# Patient Record
Sex: Female | Born: 1960 | Race: White | Hispanic: No | Marital: Married | Smoking: Never smoker
Health system: Southern US, Community
[De-identification: ages and names within clinical notes are randomized; demographics above are authoritative.]

## PROBLEM LIST (undated history)

## (undated) DIAGNOSIS — J45909 Unspecified asthma, uncomplicated: Secondary | ICD-10-CM

## (undated) DIAGNOSIS — M5430 Sciatica, unspecified side: Secondary | ICD-10-CM

## (undated) DIAGNOSIS — I10 Essential (primary) hypertension: Secondary | ICD-10-CM

## (undated) DIAGNOSIS — K219 Gastro-esophageal reflux disease without esophagitis: Secondary | ICD-10-CM

## (undated) DIAGNOSIS — I456 Pre-excitation syndrome: Secondary | ICD-10-CM

## (undated) DIAGNOSIS — T7840XA Allergy, unspecified, initial encounter: Secondary | ICD-10-CM

## (undated) DIAGNOSIS — C4490 Unspecified malignant neoplasm of skin, unspecified: Secondary | ICD-10-CM

## (undated) DIAGNOSIS — G709 Myoneural disorder, unspecified: Secondary | ICD-10-CM

## (undated) DIAGNOSIS — Z8249 Family history of ischemic heart disease and other diseases of the circulatory system: Secondary | ICD-10-CM

## (undated) DIAGNOSIS — R252 Cramp and spasm: Secondary | ICD-10-CM

## (undated) DIAGNOSIS — E785 Hyperlipidemia, unspecified: Secondary | ICD-10-CM

## (undated) DIAGNOSIS — S83282A Other tear of lateral meniscus, current injury, left knee, initial encounter: Secondary | ICD-10-CM

## (undated) DIAGNOSIS — R002 Palpitations: Secondary | ICD-10-CM

## (undated) HISTORY — DX: Sciatica, unspecified side: M54.30

## (undated) HISTORY — DX: Allergy, unspecified, initial encounter: T78.40XA

## (undated) HISTORY — DX: Palpitations: R00.2

## (undated) HISTORY — DX: Unspecified asthma, uncomplicated: J45.909

## (undated) HISTORY — DX: Essential (primary) hypertension: I10

## (undated) HISTORY — PX: COLONOSCOPY: SHX174

## (undated) HISTORY — PX: TUBAL LIGATION: SHX77

## (undated) HISTORY — DX: Gastro-esophageal reflux disease without esophagitis: K21.9

## (undated) HISTORY — PX: CARDIAC CATHETERIZATION: SHX172

## (undated) HISTORY — DX: Unspecified malignant neoplasm of skin, unspecified: C44.90

## (undated) HISTORY — DX: Cramp and spasm: R25.2

## (undated) HISTORY — PX: ABDOMINAL HYSTERECTOMY: SHX81

## (undated) HISTORY — DX: Family history of ischemic heart disease and other diseases of the circulatory system: Z82.49

## (undated) HISTORY — DX: Myoneural disorder, unspecified: G70.9

## (undated) HISTORY — DX: Pre-excitation syndrome: I45.6

## (undated) HISTORY — PX: CARDIAC ELECTROPHYSIOLOGY STUDY AND ABLATION: SHX1294

## (undated) HISTORY — PX: CHOLECYSTECTOMY: SHX55

## (undated) HISTORY — DX: Other tear of lateral meniscus, current injury, left knee, initial encounter: S83.282A

## (undated) HISTORY — PX: OTHER SURGICAL HISTORY: SHX169

## (undated) HISTORY — DX: Hyperlipidemia, unspecified: E78.5

---

## 2002-05-22 DIAGNOSIS — C439 Malignant melanoma of skin, unspecified: Secondary | ICD-10-CM

## 2002-05-22 HISTORY — DX: Malignant melanoma of skin, unspecified: C43.9

## 2002-08-04 ENCOUNTER — Other Ambulatory Visit: Admission: RE | Admit: 2002-08-04 | Discharge: 2002-08-04 | Payer: Self-pay | Admitting: Obstetrics and Gynecology

## 2002-09-10 DIAGNOSIS — D229 Melanocytic nevi, unspecified: Secondary | ICD-10-CM

## 2002-09-10 HISTORY — DX: Melanocytic nevi, unspecified: D22.9

## 2003-03-11 DIAGNOSIS — D229 Melanocytic nevi, unspecified: Secondary | ICD-10-CM

## 2003-03-11 HISTORY — DX: Melanocytic nevi, unspecified: D22.9

## 2005-06-05 ENCOUNTER — Other Ambulatory Visit: Admission: RE | Admit: 2005-06-05 | Discharge: 2005-06-05 | Payer: Self-pay | Admitting: Gynecology

## 2005-07-12 ENCOUNTER — Ambulatory Visit (HOSPITAL_BASED_OUTPATIENT_CLINIC_OR_DEPARTMENT_OTHER): Admission: RE | Admit: 2005-07-12 | Discharge: 2005-07-12 | Payer: Self-pay | Admitting: Gynecology

## 2005-07-12 ENCOUNTER — Encounter (INDEPENDENT_AMBULATORY_CARE_PROVIDER_SITE_OTHER): Payer: Self-pay | Admitting: *Deleted

## 2005-11-06 ENCOUNTER — Encounter (INDEPENDENT_AMBULATORY_CARE_PROVIDER_SITE_OTHER): Payer: Self-pay | Admitting: *Deleted

## 2005-11-06 ENCOUNTER — Ambulatory Visit (HOSPITAL_COMMUNITY): Admission: RE | Admit: 2005-11-06 | Discharge: 2005-11-07 | Payer: Self-pay | Admitting: *Deleted

## 2006-06-25 ENCOUNTER — Other Ambulatory Visit: Admission: RE | Admit: 2006-06-25 | Discharge: 2006-06-25 | Payer: Self-pay | Admitting: *Deleted

## 2006-07-04 ENCOUNTER — Ambulatory Visit: Payer: Self-pay | Admitting: Internal Medicine

## 2006-07-06 ENCOUNTER — Ambulatory Visit (HOSPITAL_COMMUNITY): Admission: RE | Admit: 2006-07-06 | Discharge: 2006-07-06 | Payer: Self-pay | Admitting: Internal Medicine

## 2006-07-06 ENCOUNTER — Ambulatory Visit: Payer: Self-pay | Admitting: Internal Medicine

## 2006-07-10 ENCOUNTER — Encounter (INDEPENDENT_AMBULATORY_CARE_PROVIDER_SITE_OTHER): Payer: Self-pay | Admitting: Internal Medicine

## 2008-01-01 ENCOUNTER — Other Ambulatory Visit: Admission: RE | Admit: 2008-01-01 | Discharge: 2008-01-01 | Payer: Self-pay | Admitting: Obstetrics and Gynecology

## 2009-02-17 ENCOUNTER — Other Ambulatory Visit: Admission: RE | Admit: 2009-02-17 | Discharge: 2009-02-17 | Payer: Self-pay | Admitting: Obstetrics and Gynecology

## 2010-01-13 DIAGNOSIS — C4491 Basal cell carcinoma of skin, unspecified: Secondary | ICD-10-CM

## 2010-01-13 HISTORY — DX: Basal cell carcinoma of skin, unspecified: C44.91

## 2010-06-28 NOTE — Op Note (Signed)
NAMEDANIKAH, Renee Walters                  ACCOUNT NO.:  [id number]   MEDICAL RECORD NO.:  [id number]          PATIENT TYPE:  AMB   LOCATION:  DAY                           FACILITY:  [facility]   PHYSICIAN:  Lionel December, M.D.    DATE OF BIRTH:  1960/06/12   DATE OF PROCEDURE:  07/06/2006  DATE OF DISCHARGE:                               OPERATIVE REPORT   PROCEDURE:  Colonoscopy.   INDICATION:  Renee Walters is a 50 year old Caucasian female who has had  diarrhea for over 20 years.  We have felt she has a IBS but she has  never responded well to therapy.  She has had multiple polyps removed  from her cervix and vagina and is concerned if she might have polyps in  her lower GI tract.  The patient was informed that there is no  association.  She is undergoing diagnostic colonoscopy.  Even if mucosa  is normal, a random biopsy was to be taken from sigmoid colon looking  for microscopic colitis.  The procedure risks were reviewed with the  patient, informed consent was obtained.   MEDICATIONS FOR CONSCIOUS SEDATION:  Demerol 50 mg IV, Versed 7 mg IV in  divided dose.   FINDINGS:  Procedure performed in endoscopy suite.  The patient's vital  signs and O2 saturation were monitored during the procedure and remained  stable.  The patient was placed in the left lateral recumbent position  and rectal examination performed.  No abnormality noted on external or  digital exam.  The Pentax video scope was placed in the rectum and  advanced under vision into sigmoid colon and beyond.  Preparation was  excellent.  The scope was passed into the cecum, which was identified by  appendiceal orifice and ileocecal valve.  Short segment of TI was also  examined and was normal.  Colonic mucosa was examined for the second  time on the way out and was normal throughout.  Random biopsies were  taken from the sigmoid colon for routine histology.  Rectal mucosa was  normal.  The scope was retroflexed to examine anorectal  junction, which  was unremarkable.  Endoscope was straightened and withdrawn.  The  patient tolerated the procedure well.   FINAL DIAGNOSIS:  Normal colonoscopy and terminal ileoscopy.  Biopsies  taken from sigmoid colon looking for microscopic and/or collagenous  colitis.   RECOMMENDATIONS:  Levbid half to 1 tablet every morning, and she will  start adding fiber supplement.  She will start 1 g per day and gradually  increase it to 4 g per day.   Please note, the patient had her celiac antibody panel drawn earlier  this week that is still pending.  I will be contacting patient with  results of biopsy and blood test.      Lionel December, M.D.  Electronically Signed     NR/MEDQ  D:  07/06/2006  T:  07/06/2006  Job:  [id number]   cc:   Selinda Flavin  Fax: [phone]

## 2010-06-28 NOTE — H&P (Signed)
NAME:  Renee Walters, Renee Walters                  ACCOUNT NO.:  [id number]   MEDICAL RECORD NO.:  [id number]          PATIENT TYPE:  AMB   LOCATION:  DAY                           FACILITY:  [facility]   PHYSICIAN:  Lionel December, M.D.    DATE OF BIRTH:  July 23, 1960   DATE OF ADMISSION:  DATE OF DISCHARGE:  LH                              HISTORY & PHYSICAL   PRESENTING COMPLAINT:  Chronic diarrhea and urgency.   HISTORY OF PRESENT ILLNESS:  This is a 50 year old Caucasian female who  has had diarrhea for at least 20 years.  I initially saw her in May 1994  and she had been symptomatic for 6 years.  She had been diagnosed with  irritable bowel syndrome but she has never had satisfactory response  therapy.  Workup at that time included normal small-bowel study,  negative stool studies.  She states she had been dealing with the  symptoms but last year she started to have more diarrhea and some lower  abdominal discomfort.  She states that she had hysterectomy in September  2007 and she has noted some relief of pressure and diarrhea but it has  not completely gone away.  What really bothers her is the urgency and  the fact that she has had a few accidents.  She states she has an  average of four stools per day, on worst day she may have six or more.  She has frequent lower abdominal cramping and urgency, every now and  then she has one formed stool.  She does not recall ever having been  constipated.  She has a good appetite.  She has occasional nausea but  denies vomiting, melena or rectal bleeding.  She has lost 23 pounds  voluntarily the past few months, she has been regularly exercising and  watching calorie intake.  She denies nocturnal bowel movements.  She  wonders if there is association between bread and her loose bowels.  Renee Walters states that she has had polyps removed from her lower genital  tract on six different occasions.  These were either cervical polyps or  vaginal polyps.  She was advised to  get her colon checked.  Her tests  for HPV have been negative.  She also gives history of having multiple  skin tags removed from her neck.  She feels maybe around 20.   She was given one medicine by Dr. Selinda Flavin which she is not taking  at the present time and does not remember the name.   PAST MEDICAL HISTORY:  Chronic diarrhea felt to be secondary to  irritable bowel syndrome.  She has had normal stool studies, normal  small-bowel follow-through in 1994 and normal colonoscopy in July 1995.   She had cholecystectomy for biliary dyskinesia in July 1994.  She had  hysterectomy in September 2007 she has had multiple polyps removed from  her cervix which on six different occasions she had six removed  recently.  She has history of hyperlipidemia but presently on no  therapy.   ALLERGIES:  TO PENICILLIN, WHICH RESULTED IN SYNCOPE.  SHE  MAY ALSO BE  ALLERGIC TO DAIRY PRODUCTS BECAUSE SHE GETS ILL SOMETIMES.   FAMILY HISTORY:  Father has coronary artery disease at age 50 and  presently in [place] participating in stem-cell studies.  Mother has  heart valve problems.  She has a sister age 22 with type 2 diabetes.  She also has obesity.  Her paternal grandmother was treated for colon  carcinoma at age 28 and is doing fine at age  46.   SOCIAL HISTORY:  She is married.  She has two children in good health.  She has never smoked cigarettes and does not drink alcohol.  She works  at [employer].   OBJECTIVE:  VITAL SIGNS:  Weight 159 pounds.  She is 5 feet 1 inch tall.  Pulse 68 per minute, blood pressure 160/100, blood pressure is 97.9.  HEENT:  Conjunctivae is pink.  Sclerae is nonicteric.  Oral pharyngeal  mucosa is normal.  No neck masses are noted.  CARDIAC:  Cardiac exam with regular rhythm.  Normal S1, S3.  No murmur  or gallop noted.  LUNGS:  Clear to auscultation.  ABDOMEN:  Abdomen is full, bowel sounds are normal.  Palpation reveals  soft abdomen with mild tenderness of both iliac  fossae without guarding.  No hepatosplenomegaly noted.  RECTAL:  Examination deferred.  No clubbing or edema noted.   ASSESSMENT:  Renee Walters is a 50 year old Caucasian female who has had two  decades of nonbloody diarrhea with urgency and occasional accidents.  It  is the unpredictability of her symptoms that is causing her lot of  distress.  While it is very likely that the final diagnosis would be  irritable bowel syndrome we need to rule out a few other conditions  which can mimic irritable bowel syndrome such celiac disease as well as  microscopic and/or collagenous colitis.  She may also have lactose  intolerance.   Her blood pressure is borderline today.  This will be rechecked at a  later date when she is in the hospital for procedure.   RECOMMENDATIONS:  1. Levbid 1/2 to 1 tablet every morning, if she has side effects she      can drop the dose.  Prescription given for 30 with 5 refills.  2. Celiac antibody panel.  3. Colonoscopy to be scheduled in the future.  I have reviewed the      procedure risks with the patient.  She is agreeable.  She would      definitely like to be sedated for this procedure.      Lionel December, M.D.  Electronically Signed     NR/MEDQ  D:  07/04/2006  T:  07/04/2006  Job:  [id number]   cc:   Selinda Flavin  Fax: [phone]

## 2010-07-01 NOTE — Op Note (Signed)
NAMEBRITNI, DRISCOLL                  ACCOUNT NO.:  [id number]   MEDICAL RECORD NO.:  [id number]          PATIENT TYPE:  AMB   LOCATION:  [facility]                         FACILITY:  [facility]   PHYSICIAN:  Ivor Costa. Farrel Gobble, M.D. DATE OF BIRTH:  July 25, 1960   DATE OF PROCEDURE:  DATE OF DISCHARGE:                                 OPERATIVE REPORT   Audio too short to transcribe (less than 5 seconds)      Ivor Costa. Farrel Gobble, M.D.     Leda Roys  D:  07/12/2005  T:  07/12/2005  Job:  [id number]

## 2010-07-01 NOTE — Discharge Summary (Signed)
Renee Walters, Renee Walters                  ACCOUNT NO.:  [id number]   MEDICAL RECORD NO.:  [id number]          PATIENT TYPE:  OIB   LOCATION:  1604                         FACILITY:  [facility]   PHYSICIAN:  Almedia Balls. Fore, M.D.   DATE OF BIRTH:  17-Nov-1960   DATE OF ADMISSION:  11/06/2005  DATE OF DISCHARGE:  11/07/2005                                 DISCHARGE SUMMARY   HISTORY:  The patient is a 50 year old with abnormal uterine bleeding,  uterine enlargement and pelvic pain for hysterectomy, possible bilateral  salpingo-oophorectomy on November 06, 2005.  The remainder of her history  and physical are as previously dictated.   LABORATORY DATA:  Preoperative hemoglobin 12.3.  Urine pregnancy test which  was negative.   HOSPITAL COURSE:  The patient was taken to the operating room on November 06, 2005, at which time abdominal supracervical hysterectomy, excision of  endocervical polyp were performed.  The patient did well postoperatively.  Diet and ambulation were progressed over the evening of November 06, 2005,  and early morning of November 07, 2005.  On the morning of November 07, 2005, she was afebrile and experiencing no problems, and it was felt that  she could be discharged at this time.   FINAL DIAGNOSES:  1. Abnormal uterine bleeding.  2. Uterine enlargement.  3. Pelvic pain.  4. Endocervical polyp.   OPERATION:  Abdominal supracervical hysterectomy, excision of endocervical  polyp.  Pathology report unavailable at the time of dictation.   DISPOSITION:  Discharged home to return to the office in 2 weeks for follow-  up.  She was instructed to gradually progress her activities at home and to  limit lifting and driving for 2 weeks.  She was fully ambulatory, on a  regular diet, and in good condition at the time of discharge.   DISCHARGE MEDICATIONS:  1. She was given prescriptions for Hydrocodone/APAP 10/325, #30 to be      taken one q.6-8h. p.r.n. pain.  2. Doxycycline  100 mg #12 to be taken one b.i.d.           ______________________________  Almedia Balls. Randell Patient, M.D.     SRF/MEDQ  D:  11/07/2005  T:  11/09/2005  Job:  [id number]

## 2010-07-01 NOTE — Op Note (Signed)
Renee Walters, Renee Walters                  ACCOUNT NO.:  [id number]   MEDICAL RECORD NO.:  [id number]          PATIENT TYPE:  OIB   LOCATION:  1604                         [facility]ACILITY:  [facility]   PHYSICIAN:  Almedia Balls. Fore, M.D.   DATE OF BIRTH:  12/21/1960   DATE OF PROCEDURE:  11/06/2005  DATE OF DISCHARGE:  11/07/2005                                 OPERATIVE REPORT   PREOPERATIVE DIAGNOSES:  Abnormal uterine bleeding, pelvic pain, uterine  enlargement.   POSTOPERATIVE DIAGNOSES:  Abnormal uterine bleeding, pelvic pain, uterine  enlargement. Pending pathology.   OPERATION:  Abdominal supracervical hysterectomy, excision of endocervical  polyp.   ANESTHESIA:  General orotracheal.   SURGEON:  Almedia Balls. Randell Patient, M.D.   FIRST ASSISTANT:  Dr. Beather Arbour.   INDICATIONS FOR SURGERY:  The patient is a 50 year old with the above-noted  problems who was counseled as to the need for surgery and the type of  surgery to be performed.  She was fully counseled as to the nature of the  procedure and the risks involved to include the risks of anesthesia, injury  to bowel, bladder, blood vessels, postoperative hemorrhage, infection and  recuperation plus possible hormone replacement should her ovaries be  removed.  She fully understands all these considerations and wishes to  proceed and has signed informed consent to proceed on November 06, 2005.   OPERATIVE FINDINGS:  On an attempt to effect descent of the uterus, it was  found that the uterus was well-supported and at that the vagina and uterus  would not descend into the vagina.  On entry into the abdomen, the uterus  was noted to be enlarged to approximately [redacted] weeks gestational size.  It was  quite soft suggesting adenomyosis.  Both ovaries had evidence of ovulation  with the corpus luteum present on the right ovary.  Exploration of the upper  abdomen revealed the lower liver edge, gallbladder, spleen, periaortic areas  and appendix to be normal  to visualization and/or palpation.   DESCRIPTION OF PROCEDURE:  With the patient under general anesthesia,  prepared and draped in the usual sterile fashion, the patient was placed in  a frog-leg position, and a speculum was placed in the vagina in an attempt  to effect descent of the uterus.  A single-tooth tenaculum was placed on the  cervix and the uterus was found to be too well-supported to descend into the  vagina.  Accordingly, it was felt that proceeding with an abdominal  procedure was indicated.  A Foley catheter was placed, and the patient was  repositioned and draped for abdominal procedure.   A lower abdominal transverse incision was made and carried into the  peritoneal cavity without difficulty.  A self-retaining retractor was placed  and the bowel was packed off.  Kelly clamps were used to clamp the utero-  ovarian anastomoses, tubes and round ligaments bilaterally for traction and  hemostasis.  Both round ligaments were transected using Bovie  electrocoagulation with development of a bladder flap anteriorly and entry  into the retroperitoneal space.  Because of the normal appearance  of the  ovaries, it was felt that they should be conserved.  Accordingly, a Heaney  clamp was placed across the uterine ovarian anastomoses and tubes  bilaterally; these structures were then cut free of the uterus and doubly  ligated with 2-0 PDS because of the patient's allergy to Vicryl. The uterine  vessels bilaterally were then skeletonized, clamped using Heaney clamps,  cut, and suture ligated with 2-0 PDS.  Similarly the cardinal ligaments and  remaining vascular bundle areas were clamped using Heaney clamps, cut, and  suture ligated with 2-0 PDS bilaterally.  It was then possible to excise the  uterine fundus from the cervix utilizing Bovie electrocoagulation.  At this  point, the endocervical polyp was encountered which was excised without  difficulty.  The endocervix was further  treated with Bovie  electrocoagulation to prevent any hemorrhage and to prevent future  leukorrhea.  The cervical stump was reapproximated and rendered hemostatic  with interrupted figure-of-eight sutures of 2-0 PDS.  There area was lavaged  copious amounts of lactated Ringer solution, and after noting that  hemostasis was maintained in the surgical site, the area was  reperitonealized with a continuous suture of 2-0 PDS.  The ovaries were  supported from the round ligaments bilaterally keeping them out of the  pelvis.  The area was observed for hemostasis, and after noting that  hemostasis was maintained and that sponge and instrument counts were  correct, the peritoneum was closed with a continuous suture of 2-0 PDS. The  fascia was closed with two sutures of #0 PDS which were brought from the  lateral aspects of the incision and tied together in the midline.  The  subcutaneous fat was reapproximated with interrupted horizontal mattress  sutures of #0 PDS.  The skin was closed with a subcuticular suture of 3-0  plain catgut.  Estimated blood loss was 150 mL.  The patient was taken to  the recovery room in good condition with clear urine in a Foley catheter  tubing.  She will be placed on an outpatient with extended recovery status  following PACU.           ______________________________  Almedia Balls. Randell Patient, M.D.     SRF/MEDQ  D:  11/06/2005  T:  11/07/2005  Job:  [id number]   cc:   Gretta Cool, M.D.  Fax: [phone]

## 2010-07-01 NOTE — H&P (Signed)
[name], [name]                  ACCOUNT NO.:  [id number]   MEDICAL RECORD NO.:  [id number]           Walters TYPE:   LOCATION:                               FACILITY:  [facility]   PHYSICIAN:  Almedia Balls. Fore, M.D.   DATE OF BIRTH:  27-Jul-1960   DATE OF ADMISSION:  11/06/2005  DATE OF DISCHARGE:                                HISTORY & PHYSICAL   HISTORY:  The Walters is a 50 year old, gravida 2, para 2 who has had  progressively severe menstrual flow and pain over the past several months.  She was evaluated by another gynecologist here in [place] who leaving  town at this time, and the Walters was referred to me for further treatment.  The Walters underwent hysteroscopy D&C in May 2007 because of the abnormal  bleeding with findings of an endometrial polyp with simple hyperplasia  without atypia.  She has continued to have abnormal bleeding since that time  in spite of being placed on oral contraceptives for suppression of ovaries  with the thought that some pelvic pain that the Walters had been  experiencing with be improved.  This did very little to suppress any pain or  change the bleeding pattern.  Preoperatively the Walters underwent  ultrasound which showed myomata of the uterus and possible adenomyosis of  the uterus.  Pap smear was normal in May 2007.  She is admitted at this time  for hysterectomy and possible bilateral salpingo-oophorectomy for the above-  noted problems.  She has been fully counseled as to the nature of the  procedure and the risks involved to include risks of anesthesia, injury to  bowel, bladder, blood vessels, ureters, postoperative hemorrhage, infection,  recuperation, possible use of hormone replacement should her ovaries be  removed.  She fully understands all these considerations and wishes to  proceed on 06 November 2005.   PAST MEDICAL HISTORY:  Tubal ligation in 1982, heart catheterization with  radiofrequency ablation of supraventricular node  with improvement in her  arrhythmias.  She has had no problems since, this was in '92, laparoscopic  cholecystectomy in '94 with an incisional infection.  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  This was found to be  probably related to Vicryl suture material.  That Walters takes Levsin SL  for probable IBS situation and has been on Loestrin 24 over the past several  months.  She is allergic to PENICILLIN, CODEINE, TROVAFLOXACIN and the  VICRYL SUTURES.  She denies tobacco or alcohol use or any caffeinated  beverages.   FAMILY HISTORY:  Paternal grandmother with carcinoma of the colon, father  and maternal grandmother and sister with diabetes mellitus with the  grandmother being insulin dependent. Father and sister and grandmother also  have had cardiovascular disease.   REVIEW OF SYSTEMS:  HEENT: Negative. CARDIORESPIRATORY:  As noted above.  GASTROINTESTINAL: As noted above.  GENITOURINARY:  As in present illness.  NEUROMUSCULAR:  Negative.   PHYSICAL EXAMINATION:  VITAL SIGNS:  Height 5  feet 1-1/4 inches, weight 172  pounds, blood pressure 142/90, respirations 18, pulse 76.  GENERAL:  Well-developed white female in no acute distress.  HEENT: Within normal limits.  NECK:  Supple without masses, adenopathy or bruits.  HEART:  Regular rate and rhythm without murmurs.  LUNGS:  Clear to P&A.  BREASTS:  Without mass bilaterally.  Axilla negative.  ABDOMEN:  Flat and soft with some tenderness bilaterally.  There were no  palpable masses.  PELVIC:  External genitalia, Bartholin's, urethra and Skene's glands within  normal limits.  Cervix somewhat inflamed.  The uterus is mid position,  approximately 8-[redacted] weeks gestational size. There are no adnexal masses but  somewhat tender bilaterally.  Anterior and posterior cul-de-sac exam is  confirmatory.  EXTREMITIES: Within normal limits.   CENTRAL NERVOUS SYSTEM:  Grossly intact.  SKIN:  Without suspicious lesions.   IMPRESSION:  Abnormal uterine bleeding, uterine enlargement.   DISPOSITION:  As noted above.           ______________________________  Almedia Balls. Renee Walters, M.D.     SRF/MEDQ  D:  10/30/2005  T:  10/30/2005  Job:  [id number]

## 2010-07-01 NOTE — Op Note (Signed)
NAMEJOLAYNE, BRANSON                  ACCOUNT NO.:  [id number]   MEDICAL RECORD NO.:  [id number]          PATIENT TYPE:  AMB   LOCATION:  NESC                         FACILITY:  [facility]   PHYSICIAN:  Ivor Costa. Farrel Gobble, M.D. DATE OF BIRTH:  March 23, 1960   DATE OF PROCEDURE:  07/12/2005  DATE OF DISCHARGE:                                 OPERATIVE REPORT   DATE OF OPERATION:  Jul 12, 2005.   PREOPERATIVE DIAGNOSIS:  Endometrial polyp with menorrhagia.   POSTOPERATIVE DIAGNOSIS:  Endometrial polyp with menorrhagia.   PROCEDURE:  D&C hysteroscopy.   SURGEON:  Ivor Costa. Lathrop, MD.   ANESTHESIA:  General with 10 ml of 0.5% Marcaine block,   I&O DEFICIT OF 30% SORBITOL SOLUTION:  20 ml.   ESTIMATED BLOOD LOSS:  Minimal.   FINDINGS:  The cervix was dilated secondary to placement of laminaria.  The  uterus was markedly anteflexed, sounding to 13 cm.  On hysteroscopy, large  endometrial polyps filled the upper portion of the cavity, the ostia were  visualized, the cavity was smooth at the end of the procedure.   DESCRIPTION OF PROCEDURE:  The patient was taken to the operating room, the  laminaria placed the night before was removed, and then prepped and draped  in the usual sterile fashion after a general anesthesia.  A bimanual exam  was performed to confirm the orientation of the uterus, a sterile weighted  speculum was then placed posteriorly, the cervix was visualized stabilized  with a single tooth tenaculum.  The cervix was then injected with a dilute  Pitressin solution for a total of 10 ml.  A uterine sound was placed to  confirm the length of the cavity as well as the orientation.  The cervix was  noted to be dilated.  The cervix was intraoperatively found to be long,  which required further dilation of the internal os from 29 to 31 after which  the operative hysteroscope was advanced through the cervix and into the  cavity.  The polyp obscured our visualization.  The scope was  removed, and  polyp forceps were placed high up into the cavity, and what appeared to be  polypoid tissue was removed.  We then replaced the hysteroscope, the base of  the polyp was then resected, the remainder of the cavity was smooth, a  gentle curettage was performed, which produced minimal additional tissue.  The ostia were visualized.  Slow retrieval  through the cervix showed no pathology.  The patient tolerated the procedure  well.  She was given 10 ml of 0.5% Marcaine for postoperative pain  management.  She was then extubated in the OR and transferred to the PACU in  stable condition.      Ivor Costa. Farrel Gobble, M.D.  Electronically Signed     THL/MEDQ  D:  07/12/2005  T:  07/12/2005  Job:  [id number]

## 2012-01-24 ENCOUNTER — Ambulatory Visit (INDEPENDENT_AMBULATORY_CARE_PROVIDER_SITE_OTHER): Payer: BC Managed Care – PPO | Admitting: Family Medicine

## 2012-01-24 ENCOUNTER — Encounter: Payer: Self-pay | Admitting: Family Medicine

## 2012-01-24 ENCOUNTER — Other Ambulatory Visit (HOSPITAL_COMMUNITY)
Admission: RE | Admit: 2012-01-24 | Discharge: 2012-01-24 | Disposition: A | Discharge status: Home / Self Care | Payer: BC Managed Care – PPO | Source: Ambulatory Visit | Attending: Family Medicine | Admitting: Family Medicine

## 2012-01-24 VITALS — BP 130/70 | HR 60 | Temp 98.2°F | Ht 60.75 in | Wt 158.0 lb

## 2012-01-24 DIAGNOSIS — I10 Essential (primary) hypertension: Secondary | ICD-10-CM

## 2012-01-24 DIAGNOSIS — Z136 Encounter for screening for cardiovascular disorders: Secondary | ICD-10-CM

## 2012-01-24 DIAGNOSIS — Z01419 Encounter for gynecological examination (general) (routine) without abnormal findings: Secondary | ICD-10-CM | POA: Insufficient documentation

## 2012-01-24 DIAGNOSIS — E785 Hyperlipidemia, unspecified: Secondary | ICD-10-CM

## 2012-01-24 DIAGNOSIS — Z1231 Encounter for screening mammogram for malignant neoplasm of breast: Secondary | ICD-10-CM

## 2012-01-24 DIAGNOSIS — Z8249 Family history of ischemic heart disease and other diseases of the circulatory system: Secondary | ICD-10-CM

## 2012-01-24 DIAGNOSIS — Z0000 Encounter for general adult medical examination without abnormal findings: Secondary | ICD-10-CM

## 2012-01-24 DIAGNOSIS — Z1151 Encounter for screening for human papillomavirus (HPV): Secondary | ICD-10-CM | POA: Insufficient documentation

## 2012-01-24 DIAGNOSIS — I456 Pre-excitation syndrome: Secondary | ICD-10-CM

## 2012-01-24 DIAGNOSIS — Z124 Encounter for screening for malignant neoplasm of cervix: Secondary | ICD-10-CM

## 2012-01-24 HISTORY — DX: Family history of ischemic heart disease and other diseases of the circulatory system: Z82.49

## 2012-01-24 LAB — COMPREHENSIVE METABOLIC PANEL
AST: 20 U/L (ref 0–37)
Albumin: 4.2 g/dL (ref 3.5–5.2)
CO2: 30 mEq/L (ref 19–32)
Chloride: 101 mEq/L (ref 96–112)
Sodium: 139 mEq/L (ref 135–145)

## 2012-01-24 LAB — LIPID PANEL
Cholesterol: 236 mg/dL — ABNORMAL HIGH (ref 0–200)
HDL: 62.5 mg/dL (ref >39.00)
Total CHOL/HDL Ratio: 4
Triglycerides: 131 mg/dL (ref 0.0–149.0)

## 2012-01-24 NOTE — Patient Instructions (Addendum)
Great to meet you. After you go to the lab, please stop by to see Renee Walters on your way out. We will call you with your lab results.  Have a wonderful Christmas.

## 2012-01-24 NOTE — Progress Notes (Signed)
Subjective:    Patient ID: Renee Walters, female    DOB: 1960/05/27, 51 y.o.   MRN: [id number]  HPI  51 yo G2P2  with h/o HTN, HLD here to establish care and would like a CPX today.  Due for mammogram.  Denies any family h/o breast, uterine or cervical CA. She did have a flu shot this year.  Works as Community education officer.  She is s/p hysterectomy for DUB but thinks she still has a cervix.  Has not had a pap smear or CPX in years. She did have a colonoscopy in 2008 due to irregular BMs- told it was IBS and colonoscopy was normal.  WPW- s/p ablation in 1992.  Has not seen a cardiologist since.  Denies any CP, SOB or palpiations.  HLD- has been on statin for over 10 years.  Strong family h/o HLD and CAD.  Dad had first MI in his 48s.  Patient Active Problem List  Diagnosis  . Hyperlipidemia  . Hypertension  . Routine general medical examination at a health care facility   Past Medical History  Diagnosis Date  . Hyperlipidemia   . Hypertension   . WPW (Wolff-Parkinson-White syndrome)     s/p ablation in 1992  . Skin cancer    Past Surgical History  Procedure Date  . Abdominal hysterectomy   . Cholecystectomy   . Tubal ligation    History  Substance Use Topics  . Smoking status: Never Smoker   . Smokeless tobacco: Not on file  . Alcohol Use: Not on file   Family History  Problem Relation Age of Onset  . Heart disease Father 71    s/p CABG, stents  . Heart disease Paternal Grandfather    Allergies  Allergen Reactions  . Codeine Nausea Only  . Penicillins Other (See Comments)    Passed out as a child   Current Outpatient Prescriptions on File Prior to Visit  Medication Sig Dispense Refill  . lisinopril-hydrochlorothiazide (PRINZIDE,ZESTORETIC) 10-12.5 MG per tablet Take 1 tablet by mouth daily.      . metoprolol (LOPRESSOR) 50 MG tablet Take 50 mg by mouth daily.      . simvastatin (ZOCOR) 80 MG tablet Take 80 mg by mouth at bedtime.       The PMH, PSH, Social  History, Family History, Medications, and allergies have been reviewed in [facility], and have been updated if relevant.   Review of Systems See HPI Patient reports no  vision/ hearing changes,anorexia, weight change, fever ,adenopathy, persistant / recurrent hoarseness, swallowing issues, chest pain, edema,persistant / recurrent cough, hemoptysis, dyspnea(rest, exertional, paroxysmal nocturnal), gastrointestinal  bleeding (melena, rectal bleeding), abdominal pain, excessive heart burn, GU symptoms(dysuria, hematuria, pyuria, voiding/incontinence  Issues) syncope, focal weakness, severe memory loss, concerning skin lesions, depression, anxiety, abnormal bruising/bleeding, major joint swelling, breast masses or abnormal vaginal bleeding.       Objective:   Physical Exam BP 130/70  Pulse 60  Temp 98.2 F (36.8 C)  Ht 5' 0.75" (1.543 m)  Wt 158 lb (71.668 kg)  BMI 30.10 kg/m2  General:  Well-developed,well-nourished,in no acute distress; alert,appropriate and cooperative throughout examination Head:  normocephalic and atraumatic.   Eyes:  vision grossly intact, pupils equal, pupils round, and pupils reactive to light.   Ears:  R ear normal and L ear normal.   Nose:  no external deformity.   Mouth:  good dentition.   Neck:  No deformities, masses, or tenderness noted. Breasts:  No mass, nodules, thickening, tenderness, bulging, retraction,  inflamation, nipple discharge or skin changes noted.   Lungs:  Normal respiratory effort, chest expands symmetrically. Lungs are clear to auscultation, no crackles or wheezes. Heart:  Normal rate and regular rhythm. S1 and S2 normal without gallop, murmur, click, rub or other extra sounds. Abdomen:  Bowel sounds positive,abdomen soft and non-tender without masses, organomegaly or hernias noted. Rectal:  no external abnormalities.   Genitalia:  Pelvic Exam:        External: normal female genitalia without lesions or masses        Vagina: normal without lesions  or masses        Cervix: normal without lesions or masses        Adnexa: normal bimanual exam without masses or fullness        Uterus: absent        Pap smear: performed Msk:  No deformity or scoliosis noted of thoracic or lumbar spine.   Extremities:  No clubbing, cyanosis, edema, or deformity noted with normal full range of motion of all joints.   Neurologic:  alert & oriented X3 and gait normal.   Skin:  Intact without suspicious lesions or rashes Cervical Nodes:  No lymphadenopathy noted Axillary Nodes:  No palpable lymphadenopathy Psych:  Cognition and judgment appear intact. Alert and cooperative with normal attention span and concentration. No apparent delusions, illusions, hallucinations      Assessment & Plan:   1. Routine general medical examination at a health care facility  Reviewed preventive care protocols, scheduled due services, and updated immunizations Discussed nutrition, exercise, diet, and healthy lifestyle.  Comprehensive metabolic panel, Cytology - PAP  2. Other screening mammogram  MM Digital Screening  3. Screening for ischemic heart disease  Lipid Panel  4. Screening for cervical cancer  Cytology - PAP  5. Family history of early CAD  Given family history and her h/o WPW s/p ablation, will refer to cards to get established. The patient indicates understanding of these issues and agrees with the plan.  Ambulatory referral to Cardiology  6. WPW (Wolff-Parkinson-White syndrome)  Asymptomatic.  See above. Ambulatory referral to Cardiology  7. Hypertension  Stable on current meds.   8. Hyperlipidemia  Recheck lipid panel, CMET today.

## 2012-01-31 ENCOUNTER — Encounter: Payer: Self-pay | Admitting: *Deleted

## 2012-02-27 ENCOUNTER — Telehealth: Payer: Self-pay

## 2012-02-27 NOTE — Telephone Encounter (Signed)
Left information requested on v/m pt to see Dr Rollene Rotunda on 02/28/12 at 3 pm; located at [facility] [address] [place] [address]; phone # [phone].

## 2012-02-27 NOTE — Telephone Encounter (Signed)
Pt left v/m cannot find information for appt with cardiologist; pt request call back with name of doctor where located and phone #. Left. V/m for pt to call back.

## 2012-02-28 ENCOUNTER — Ambulatory Visit (INDEPENDENT_AMBULATORY_CARE_PROVIDER_SITE_OTHER): Payer: BC Managed Care – PPO | Admitting: Cardiology

## 2012-02-28 ENCOUNTER — Encounter: Payer: Self-pay | Admitting: Cardiology

## 2012-02-28 VITALS — BP 125/75 | HR 60 | Ht 61.0 in | Wt 165.0 lb

## 2012-02-28 DIAGNOSIS — I456 Pre-excitation syndrome: Secondary | ICD-10-CM

## 2012-02-28 NOTE — Progress Notes (Signed)
HPI The patient presents for follow up of WPW. She has a history of palpitations with ablation in 1992. This was done at [facility].  She has had no tachycardia palpitations since that time.  She is active and exercises.  She denies any palpitations, presyncope or syncope. She denies any chest pressure, neck or arm discomfort. She he has no shortness of breath, PND or orthopnea.    Allergies  Allergen Reactions  . Codeine Nausea Only  . Penicillins Other (See Comments)    Passed out as a child    Current Outpatient Prescriptions  Medication Sig Dispense Refill  . lisinopril-hydrochlorothiazide (PRINZIDE,ZESTORETIC) 10-12.5 MG per tablet Take 1 tablet by mouth daily.      . metoprolol (LOPRESSOR) 50 MG tablet Take 50 mg by mouth daily.      . simvastatin (ZOCOR) 80 MG tablet Take 80 mg by mouth at bedtime.        Past Medical History  Diagnosis Date  . Hyperlipidemia   . Hypertension   . WPW (Wolff-Parkinson-White syndrome)     s/p ablation in 1992  . Skin cancer     Past Surgical History  Procedure Date  . Abdominal hysterectomy   . Cholecystectomy   . Tubal ligation   . Colonoscopy     Family History  Problem Relation Age of Onset  . Heart disease Father 13    s/p CABG, stents  . Heart disease Paternal Grandfather   . Evelene Croon Parkinson White syndrome Sister   . Evelene Croon Parkinson White syndrome      Sister's daughter    History   Social History  . Marital Status: Married    Spouse Name: N/A    Number of Children: 2  . Years of Education: N/A   Occupational History  .     Social History Main Topics  . Smoking status: Never Smoker   . Smokeless tobacco: Not on file  . Alcohol Use: Not on file  . Drug Use: Not on file  . Sexually Active: Not on file   Other Topics Concern  . Not on file   Social History Narrative  . No narrative on file    ROS:  As stated in the HPI and negative for all other systems.   PHYSICAL EXAM BP 125/75  Pulse 60  Ht 5\' 1"   (1.549 m)  Wt 165 lb (74.844 kg)  BMI 31.18 kg/m2 GENERAL:  Well appearing HEENT:  Pupils equal round and reactive, fundi not visualized, oral mucosa unremarkable NECK:  No jugular venous distention, waveform within normal limits, carotid upstroke brisk and symmetric, no bruits, no thyromegaly LYMPHATICS:  No cervical, inguinal adenopathy LUNGS:  Clear to auscultation bilaterally BACK:  No CVA tenderness CHEST:  Unremarkable HEART:  PMI not displaced or sustained,S1 and S2 within normal limits, no S3, no S4, no clicks, no rubs, no murmurs ABD:  Flat, positive bowel sounds normal in frequency in pitch, no bruits, no rebound, no guarding, no midline pulsatile mass, no hepatomegaly, no splenomegaly EXT:  2 plus pulses throughout, no edema, no cyanosis no clubbing SKIN:  No rashes no nodules NEURO:  Cranial nerves II through XII grossly intact, motor grossly intact throughout PSYCH:  Cognitively intact, oriented to person place and time  EKG:  Sinus rhythm, rate 60, left bundle branch block, left axis deviation. No old EKGs for comparison. 02/28/2012  ASSESSMENT AND PLAN  WPW  The patient has no symptoms related to this. No further cardiovascular testing is suggested.  HTN Her blood pressures well controlled. She will continue the meds as listed.  LBBB She reports that this is a chronic EKG finding. No further evaluation is necessary.  Dyslipidemia Her last LDL was in the 150s. This was on 80 mg of simvastatin. I suspect that her baseline LDL is probably greater than 190. Therefore, treatment is appropriate. I think this is even more appropriate given the family history. The suggestion that by guidelines would be too painful rate 50% reduction in LDL which we have not achieved. Given this I would suggest perhaps 80 mg of Lipitor or 40 mg of Crestor. I discussed this with her at length. She will consider this and further discuss this with her primary provider.

## 2012-02-28 NOTE — Patient Instructions (Addendum)
The current medical regimen is effective;  continue present plan and medications.  Follow up in 1 year with Dr Hochrein.  You will receive a letter in the mail 2 months before you are due.  Please call us when you receive this letter to schedule your follow up appointment.  

## 2012-03-05 ENCOUNTER — Ambulatory Visit
Admission: RE | Admit: 2012-03-05 | Discharge: 2012-03-05 | Disposition: A | Discharge status: Home / Self Care | Payer: BC Managed Care – PPO | Source: Ambulatory Visit | Attending: Family Medicine | Admitting: Family Medicine

## 2012-03-05 DIAGNOSIS — Z1231 Encounter for screening mammogram for malignant neoplasm of breast: Secondary | ICD-10-CM

## 2013-09-24 ENCOUNTER — Other Ambulatory Visit: Payer: Self-pay

## 2013-09-24 NOTE — Telephone Encounter (Signed)
Pt left v/m requesting refill lisinopril HCTZ and simvastatin to [facility] [address]. Please advise. Pt last seen 01/24/2012. No future appt scheduled.

## 2013-09-24 NOTE — Telephone Encounter (Signed)
Must be seen for further refills.  If follow up appt made, ok to refill until appointment only.

## 2013-09-25 MED ORDER — LISINOPRIL-HYDROCHLOROTHIAZIDE 10-12.5 MG PO TABS: 1.0000 | ORAL_TABLET | Freq: Every day | ORAL | Status: DC

## 2013-09-25 MED ORDER — SIMVASTATIN 80 MG PO TABS: 80.0000 mg | ORAL_TABLET | Freq: Every day | ORAL | Status: DC

## 2013-09-25 NOTE — Telephone Encounter (Signed)
Spoke to pt and scheduled f/u appt for 10/03/13. Rx sent for #30

## 2013-10-03 ENCOUNTER — Ambulatory Visit: Payer: BC Managed Care – PPO | Admitting: Family Medicine

## 2014-03-31 ENCOUNTER — Other Ambulatory Visit: Payer: Self-pay | Admitting: Dermatology

## 2014-09-16 ENCOUNTER — Telehealth: Payer: Self-pay | Admitting: Family Medicine

## 2014-09-16 NOTE — Telephone Encounter (Signed)
Pt dropped off past medical records that she sound at home while going through the files. If you have any questions, call [phone], thanks. Placing on cart for distribution

## 2014-11-05 ENCOUNTER — Ambulatory Visit (INDEPENDENT_AMBULATORY_CARE_PROVIDER_SITE_OTHER): Payer: BLUE CROSS/BLUE SHIELD | Admitting: Cardiology

## 2014-11-05 ENCOUNTER — Encounter: Payer: Self-pay | Admitting: Cardiology

## 2014-11-05 VITALS — BP 142/82 | HR 68 | Ht 61.0 in | Wt 180.5 lb

## 2014-11-05 DIAGNOSIS — I456 Pre-excitation syndrome: Secondary | ICD-10-CM

## 2014-11-05 DIAGNOSIS — R002 Palpitations: Secondary | ICD-10-CM

## 2014-11-05 DIAGNOSIS — Z79899 Other long term (current) drug therapy: Secondary | ICD-10-CM

## 2014-11-05 DIAGNOSIS — I10 Essential (primary) hypertension: Secondary | ICD-10-CM

## 2014-11-05 DIAGNOSIS — E785 Hyperlipidemia, unspecified: Secondary | ICD-10-CM | POA: Diagnosis not present

## 2014-11-05 HISTORY — DX: Palpitations: R00.2

## 2014-11-05 NOTE — Progress Notes (Signed)
HPI The patient presents for follow up of WPW. She has a history of palpitations with ablation in 1992. This was done at [facility].   Today I was able to review some records and she had a concealed left sided accessory pathway.  She has had no tachycardia palpitations since that time.   She has noted some increased isolated palpitations that she describes as skipped beat now again. She might have these daily they are not sustained and not like her previous symptoms. She does not have presyncope or syncope. She denies any chest pressure, neck or arm discomfort. She's not exercising as much as she used to and has a sedentary desk job. He has no new shortness of breath, PND or orthopnea.  Allergies  Allergen Reactions  . Codeine Nausea Only  . Penicillins Other (See Comments)    Passed out as a child    Current Outpatient Prescriptions  Medication Sig Dispense Refill  . lisinopril-hydrochlorothiazide (PRINZIDE,ZESTORETIC) 10-12.5 MG per tablet Take 1 tablet by mouth daily. 30 tablet 0  . metoprolol (LOPRESSOR) 50 MG tablet Take 50 mg by mouth daily.    . simvastatin (ZOCOR) 80 MG tablet Take 1 tablet (80 mg total) by mouth at bedtime. 30 tablet 0   No current facility-administered medications for this visit.    Past Medical History  Diagnosis Date  . Hyperlipidemia   . Hypertension   . WPW (Wolff-Parkinson-White syndrome)     s/p ablation in 1992  . Skin cancer     Past Surgical History  Procedure Laterality Date  . Abdominal hysterectomy    . Cholecystectomy    . Tubal ligation    . Colonoscopy       ROS:  As stated in the HPI and negative for all other systems.   PHYSICAL EXAM BP 142/82 mmHg  Pulse 68  Ht 5\' 1"  (1.549 m)  Wt 180 lb 8 oz (81.874 kg)  BMI 34.12 kg/m2 GENERAL:  Well appearing HEENT:  Pupils equal round and reactive, fundi not visualized, oral mucosa unremarkable NECK:  No jugular venous distention, waveform within normal limits, carotid upstroke brisk  and symmetric, no bruits, no thyromegaly LUNGS:  Clear to auscultation bilaterally BACK:  No CVA tenderness CHEST:  Unremarkable HEART:  PMI not displaced or sustained,S1 and S2 within normal limits, no S3, no S4, no clicks, no rubs, no murmurs ABD:  Flat, positive bowel sounds normal in frequency in pitch, no bruits, no rebound, no guarding, no midline pulsatile mass, no hepatomegaly, no splenomegaly EXT:  2 plus pulses throughout, no edema, no cyanosis no clubbing   EKG:  Sinus rhythm, rate 68, left bundle branch block, left axis deviation. No old EKGs for comparison. 11/05/2014  ASSESSMENT AND PLAN  WPW  The patient has no symptoms related to this. No further cardiovascular testing is suggested.  HTN Her blood pressures well controlled. She will continue the meds as listed.  LBBB She reports that this is a chronic EKG finding. No further evaluation is necessary.  Dyslipidemia Her last LDL was in the 150s. This was on 80 mg of simvastatin. I suspect that her baseline LDL is probably greater than 190. Therefore, treatment is appropriate. I think this is even more appropriate given the family history.   I will check a fasting lipid profile liver enzymes.  PALPITATIONS  I suspect she is having PACs or PVCs. She prefers no further treatment or evaluation has a are not particularly problematic. She'll let me know if they  get worse and time.

## 2014-11-05 NOTE — Patient Instructions (Signed)
Your physician recommends that you schedule a follow-up appointment in: As Needed  Your physician recommends that you return for lab work Fasting Lipids and CMP

## 2014-11-17 LAB — LIPID PANEL
Cholesterol: 270 mg/dL — ABNORMAL HIGH (ref 125–200)
HDL: 60 mg/dL (ref >=46)
LDL Cholesterol: 178 mg/dL — ABNORMAL HIGH (ref <130)
Total CHOL/HDL Ratio: 4.5 Ratio (ref <=5.0)
Triglycerides: 162 mg/dL — ABNORMAL HIGH (ref <150)
VLDL: 32 mg/dL — ABNORMAL HIGH (ref <30)

## 2014-11-17 LAB — COMPREHENSIVE METABOLIC PANEL
ALBUMIN: 4.2 g/dL (ref 3.6–5.1)
ALT: 25 U/L (ref 6–29)
AST: 17 U/L (ref 10–35)
Alkaline Phosphatase: 82 U/L (ref 33–130)
BILIRUBIN TOTAL: 0.4 mg/dL (ref 0.2–1.2)
BUN: 18 mg/dL (ref 7–25)
CO2: 27 mmol/L (ref 20–31)
CREATININE: 0.66 mg/dL (ref 0.50–1.05)
Calcium: 9.4 mg/dL (ref 8.6–10.4)
Chloride: 105 mmol/L (ref 98–110)
Glucose, Bld: 95 mg/dL (ref 65–99)
Potassium: 4.8 mmol/L (ref 3.5–5.3)
SODIUM: 141 mmol/L (ref 135–146)
TOTAL PROTEIN: 6.6 g/dL (ref 6.1–8.1)

## 2014-11-23 ENCOUNTER — Other Ambulatory Visit: Payer: Self-pay | Admitting: *Deleted

## 2014-11-23 DIAGNOSIS — E785 Hyperlipidemia, unspecified: Secondary | ICD-10-CM

## 2015-03-18 ENCOUNTER — Other Ambulatory Visit: Payer: Self-pay | Admitting: *Deleted

## 2015-03-18 MED ORDER — SIMVASTATIN 80 MG PO TABS: 80.0000 mg | ORAL_TABLET | Freq: Every day | ORAL | Status: DC

## 2015-03-18 NOTE — Telephone Encounter (Signed)
Rx request sent to pharmacy.  

## 2015-05-13 ENCOUNTER — Other Ambulatory Visit: Payer: Self-pay

## 2016-03-17 ENCOUNTER — Encounter: Payer: Self-pay | Admitting: Internal Medicine

## 2016-12-26 DIAGNOSIS — D229 Melanocytic nevi, unspecified: Secondary | ICD-10-CM | POA: Diagnosis not present

## 2016-12-26 DIAGNOSIS — Z85820 Personal history of malignant melanoma of skin: Secondary | ICD-10-CM | POA: Diagnosis not present

## 2017-01-02 ENCOUNTER — Encounter: Payer: Self-pay | Admitting: Family Medicine

## 2017-01-02 ENCOUNTER — Ambulatory Visit (INDEPENDENT_AMBULATORY_CARE_PROVIDER_SITE_OTHER): Payer: Commercial Managed Care - PPO | Admitting: Family Medicine

## 2017-01-02 VITALS — BP 136/84 | HR 73 | Temp 97.9°F | Ht 61.0 in | Wt 179.0 lb

## 2017-01-02 DIAGNOSIS — Z1239 Encounter for other screening for malignant neoplasm of breast: Secondary | ICD-10-CM

## 2017-01-02 DIAGNOSIS — E785 Hyperlipidemia, unspecified: Secondary | ICD-10-CM

## 2017-01-02 DIAGNOSIS — Z1211 Encounter for screening for malignant neoplasm of colon: Secondary | ICD-10-CM | POA: Diagnosis not present

## 2017-01-02 DIAGNOSIS — Z0000 Encounter for general adult medical examination without abnormal findings: Secondary | ICD-10-CM | POA: Insufficient documentation

## 2017-01-02 DIAGNOSIS — I10 Essential (primary) hypertension: Secondary | ICD-10-CM | POA: Diagnosis not present

## 2017-01-02 DIAGNOSIS — Z01419 Encounter for gynecological examination (general) (routine) without abnormal findings: Secondary | ICD-10-CM

## 2017-01-02 DIAGNOSIS — Z1231 Encounter for screening mammogram for malignant neoplasm of breast: Secondary | ICD-10-CM

## 2017-01-02 LAB — CBC WITH DIFFERENTIAL/PLATELET
BASOS PCT: 0.4 % (ref 0.0–3.0)
Basophils Absolute: 0 10*3/uL (ref 0.0–0.1)
EOS ABS: 0.2 10*3/uL (ref 0.0–0.7)
Eosinophils Relative: 3 % (ref 0.0–5.0)
HCT: 40.1 % (ref 36.0–46.0)
Hemoglobin: 13.1 g/dL (ref 12.0–15.0)
Lymphocytes Relative: 27.6 % (ref 12.0–46.0)
Lymphs Abs: 1.6 10*3/uL (ref 0.7–4.0)
MCHC: 32.7 g/dL (ref 30.0–36.0)
MCV: 88.9 fl (ref 78.0–100.0)
MONO ABS: 0.3 10*3/uL (ref 0.1–1.0)
Monocytes Relative: 5 % (ref 3.0–12.0)
NEUTROS ABS: 3.8 10*3/uL (ref 1.4–7.7)
Neutrophils Relative %: 64 % (ref 43.0–77.0)
PLATELETS: 288 10*3/uL (ref 150.0–400.0)
RBC: 4.51 Mil/uL (ref 3.87–5.11)
RDW: 14.4 % (ref 11.5–15.5)
WBC: 6 10*3/uL (ref 4.0–10.5)

## 2017-01-02 LAB — COMPREHENSIVE METABOLIC PANEL
ALBUMIN: 4.4 g/dL (ref 3.5–5.2)
ALT: 17 U/L (ref 0–35)
AST: 14 U/L (ref 0–37)
Alkaline Phosphatase: 85 U/L (ref 39–117)
BILIRUBIN TOTAL: 0.4 mg/dL (ref 0.2–1.2)
BUN: 11 mg/dL (ref 6–23)
CHLORIDE: 102 meq/L (ref 96–112)
CO2: 31 mEq/L (ref 19–32)
CREATININE: 0.65 mg/dL (ref 0.40–1.20)
Calcium: 9.8 mg/dL (ref 8.4–10.5)
GFR: 100.11 mL/min (ref >60.00)
Glucose, Bld: 101 mg/dL — ABNORMAL HIGH (ref 70–99)
Potassium: 4.6 mEq/L (ref 3.5–5.1)
SODIUM: 139 meq/L (ref 135–145)
Total Protein: 6.7 g/dL (ref 6.0–8.3)

## 2017-01-02 LAB — LIPID PANEL
CHOLESTEROL: 270 mg/dL — AB (ref 0–200)
HDL: 67.6 mg/dL (ref >39.00)
NonHDL: 202.48
TRIGLYCERIDES: 240 mg/dL — AB (ref 0.0–149.0)
Total CHOL/HDL Ratio: 4
VLDL: 48 mg/dL — ABNORMAL HIGH (ref 0.0–40.0)

## 2017-01-02 LAB — TSH: TSH: 2.63 u[IU]/mL (ref 0.35–4.50)

## 2017-01-02 LAB — LDL CHOLESTEROL, DIRECT: LDL DIRECT: 165 mg/dL

## 2017-01-02 NOTE — Progress Notes (Signed)
Subjective:   Patient ID: Renee Walters, female    DOB: 03/11/1960, 56 y.o.   MRN: [id number]  Renee Walters is a pleasant 56 y.o. year old female who presents to clinic today with New Patient (Initial Visit) and Annual Exam  on 01/02/2017  HPI:  Health Maintenance  Topic Date Due  . Hepatitis C Screening  [birth year]  . HIV Screening  09/21/1975  . TETANUS/TDAP  09/21/1979  . COLONOSCOPY  09/21/2010  . MAMMOGRAM  03/05/2014  . INFLUENZA VACCINE  Completed     Has not been since 2014. No taking any rxs anymore. Changed jobs- much happier, less stress. Does not feel she needs blood pressure medication anymore.  Remote h/o hysterectomy. Due for mammogram and colonoscopy.  Also due for labs.  No current outpatient medications on file prior to visit.   No current facility-administered medications on file prior to visit.     Allergies  Allergen Reactions  . Codeine Nausea Only  . Penicillins Other (See Comments)    Passed out as a child    Past Medical History:  Diagnosis Date  . Hyperlipidemia   . Hypertension   . Skin cancer   . WPW (Wolff-Parkinson-White syndrome)    s/p ablation in 1992    Past Surgical History:  Procedure Laterality Date  . ABDOMINAL HYSTERECTOMY    . CHOLECYSTECTOMY    . COLONOSCOPY    . TUBAL LIGATION      Family History  Problem Relation Age of Onset  . Heart disease Father 68       s/p CABG, stents  . Heart disease Paternal Grandfather   . Yves Dill Parkinson White syndrome Sister   . Yves Dill Parkinson White syndrome Unknown        Sister's daughter    Social History   Socioeconomic History  . Marital status: Married    Spouse name: Not on file  . Number of children: 2  . Years of education: Not on file  . Highest education level: Not on file  Social Needs  . Financial resource strain: Not on file  . Food insecurity - worry: Not on file  . Food insecurity - inability: Not on file  . Transportation needs - medical: Not  on file  . Transportation needs - non-medical: Not on file  Occupational History  . Not on file  Tobacco Use  . Smoking status: Never Smoker  . Smokeless tobacco: Never Used  Substance and Sexual Activity  . Alcohol use: Yes    Frequency: Never    Comment: occasionally  . Drug use: No  . Sexual activity: Not on file  Other Topics Concern  . Not on file  Social History Narrative  . Not on file   The PMH, PSH, Social History, Family History, Medications, and allergies have been reviewed in [facility], and have been updated if relevant.   Review of Systems  Constitutional: Negative.   HENT: Negative.   Eyes: Negative.   Respiratory: Negative.   Cardiovascular: Negative.   Gastrointestinal: Negative.   Endocrine: Negative.   Genitourinary: Negative.   Musculoskeletal: Negative.   Skin: Negative.   Allergic/Immunologic: Negative.   Neurological: Negative.   Hematological: Negative.   Psychiatric/Behavioral: Negative.   All other systems reviewed and are negative.      Objective:    BP 136/84 (BP Location: Left Arm, Patient Position: Sitting, Cuff Size: Normal)   Pulse 73   Temp 97.9 F (36.6 C) (Oral)   Ht 5'  1" (1.549 m)   Wt 179 lb (81.2 kg)   SpO2 100%   BMI 33.82 kg/m    Physical Exam   General:  Well-developed,well-nourished,in no acute distress; alert,appropriate and cooperative throughout examination Head:  normocephalic and atraumatic.   Eyes:  vision grossly intact, PERRL Ears:  R ear normal and L ear normal externally, TMs clear bilaterally Nose:  no external deformity.   Mouth:  good dentition.   Neck:  No deformities, masses, or tenderness noted. Breasts:  No mass, nodules, thickening, tenderness, bulging, retraction, inflamation, nipple discharge or skin changes noted.   Lungs:  Normal respiratory effort, chest expands symmetrically. Lungs are clear to auscultation, no crackles or wheezes. Heart:  Normal rate and regular rhythm. S1 and S2 normal  without gallop, murmur, click, rub or other extra sounds. Abdomen:  Bowel sounds positive,abdomen soft and non-tender without masses, organomegaly or hernias noted. Rectal:  no external abnormalities.   Genitalia:  Pelvic Exam:        External: normal female genitalia without lesions or masses        Vagina: normal without lesions or masses        Cervix: absent        Adnexa: normal bimanual exam without masses or fullness        Uterus: absent Msk:  No deformity or scoliosis noted of thoracic or lumbar spine.   Extremities:  No clubbing, cyanosis, edema, or deformity noted with normal full range of motion of all joints.   Neurologic:  alert & oriented X3 and gait normal.   Skin:  Intact without suspicious lesions or rashes Cervical Nodes:  No lymphadenopathy noted Axillary Nodes:  No palpable lymphadenopathy Psych:  Cognition and judgment appear intact. Alert and cooperative with normal attention span and concentration. No apparent delusions, illusions, hallucinations       Assessment & Plan:   Well woman exam - Plan: CBC with Differential/Platelet, Comprehensive metabolic panel, Lipid panel, TSH  Essential hypertension  Hyperlipidemia, unspecified hyperlipidemia type  Screening for breast cancer - Plan: MM Digital Screening  Screening for malignant neoplasm of colon - Plan: Ambulatory referral to Gastroenterology No Follow-up on file.

## 2017-01-02 NOTE — Assessment & Plan Note (Signed)
Reviewed preventive care protocols, scheduled due services, and updated immunizations Discussed nutrition, exercise, diet, and healthy lifestyle.  Orders Placed This Encounter  Procedures  . MM Digital Screening  . CBC with Differential/Platelet  . Comprehensive metabolic panel  . Lipid panel  . TSH  . Ambulatory referral to Gastroenterology

## 2017-01-02 NOTE — Patient Instructions (Signed)
Great to see you.  Please stop by to see Vaughan Basta on your way out.  Please call the breast center at [phone] to schedule your mammogram.  We will call you with your results and you can view them online.

## 2017-01-03 ENCOUNTER — Other Ambulatory Visit: Payer: Self-pay | Admitting: Family Medicine

## 2017-01-03 DIAGNOSIS — E785 Hyperlipidemia, unspecified: Secondary | ICD-10-CM

## 2017-01-03 MED ORDER — SIMVASTATIN 20 MG PO TABS: 20.0000 mg | ORAL_TABLET | Freq: Every day | ORAL | 3 refills | Status: DC

## 2017-01-12 ENCOUNTER — Encounter: Payer: Self-pay | Admitting: *Deleted

## 2017-01-12 ENCOUNTER — Other Ambulatory Visit: Payer: Self-pay | Admitting: Family Medicine

## 2017-01-12 DIAGNOSIS — Z1231 Encounter for screening mammogram for malignant neoplasm of breast: Secondary | ICD-10-CM

## 2017-01-31 ENCOUNTER — Encounter: Payer: Self-pay | Admitting: Gastroenterology

## 2017-02-14 ENCOUNTER — Ambulatory Visit: Payer: Self-pay

## 2017-03-12 ENCOUNTER — Other Ambulatory Visit: Payer: Self-pay | Admitting: Family Medicine

## 2017-03-12 MED ORDER — SIMVASTATIN 20 MG PO TABS: 20.0000 mg | ORAL_TABLET | Freq: Every day | ORAL | 3 refills | Status: DC

## 2017-03-12 NOTE — Telephone Encounter (Signed)
Copied from [place] [id number]. Topic: General - Other >> Mar 12, 2017  2:10 PM Darl Householder, RMA wrote: Reason for CRM: Medication refill request for Simvastatin 20 mg to be sent to [facility]

## 2017-03-16 ENCOUNTER — Ambulatory Visit
Admission: RE | Admit: 2017-03-16 | Discharge: 2017-03-16 | Disposition: A | Discharge status: Home / Self Care | Payer: Commercial Managed Care - PPO | Source: Ambulatory Visit | Attending: Family Medicine | Admitting: Family Medicine

## 2017-03-16 DIAGNOSIS — Z1231 Encounter for screening mammogram for malignant neoplasm of breast: Secondary | ICD-10-CM | POA: Diagnosis not present

## 2017-03-19 ENCOUNTER — Ambulatory Visit: Payer: Self-pay | Admitting: Family Medicine

## 2017-03-19 ENCOUNTER — Ambulatory Visit (AMBULATORY_SURGERY_CENTER): Payer: Self-pay | Admitting: *Deleted

## 2017-03-19 ENCOUNTER — Other Ambulatory Visit: Payer: Self-pay

## 2017-03-19 VITALS — Ht 61.0 in | Wt 185.0 lb

## 2017-03-19 DIAGNOSIS — Z1211 Encounter for screening for malignant neoplasm of colon: Secondary | ICD-10-CM

## 2017-03-19 MED ORDER — NA SULFATE-K SULFATE-MG SULF 17.5-3.13-1.6 GM/177ML PO SOLN: 1.0000 | Freq: Once | ORAL | 0 refills | Status: AC

## 2017-03-19 NOTE — Progress Notes (Signed)
No egg or soy allergy known to patient  No issues with past sedation with any surgeries  or procedures, no intubation problems  No diet pills per patient No home 02 use per patient  No blood thinners per patient  Pt denies issues with constipation  No A fib or A flutter  EMMI video sent to pt's e mail  

## 2017-03-20 ENCOUNTER — Encounter: Payer: Self-pay | Admitting: Gastroenterology

## 2017-03-20 ENCOUNTER — Other Ambulatory Visit: Payer: Self-pay

## 2017-03-21 ENCOUNTER — Ambulatory Visit (INDEPENDENT_AMBULATORY_CARE_PROVIDER_SITE_OTHER): Payer: Commercial Managed Care - PPO | Admitting: Family Medicine

## 2017-03-21 ENCOUNTER — Encounter: Payer: Self-pay | Admitting: Family Medicine

## 2017-03-21 VITALS — BP 132/88 | HR 74 | Temp 98.5°F | Ht 61.0 in | Wt 181.8 lb

## 2017-03-21 DIAGNOSIS — R252 Cramp and spasm: Secondary | ICD-10-CM | POA: Diagnosis not present

## 2017-03-21 DIAGNOSIS — Z23 Encounter for immunization: Secondary | ICD-10-CM | POA: Diagnosis not present

## 2017-03-21 DIAGNOSIS — M5430 Sciatica, unspecified side: Secondary | ICD-10-CM | POA: Diagnosis not present

## 2017-03-21 HISTORY — DX: Cramp and spasm: R25.2

## 2017-03-21 HISTORY — DX: Sciatica, unspecified side: M54.30

## 2017-03-21 LAB — COMPREHENSIVE METABOLIC PANEL
ALK PHOS: 80 U/L (ref 39–117)
ALT: 20 U/L (ref 0–35)
AST: 16 U/L (ref 0–37)
Albumin: 4.4 g/dL (ref 3.5–5.2)
BUN: 15 mg/dL (ref 6–23)
CO2: 31 meq/L (ref 19–32)
Calcium: 9.5 mg/dL (ref 8.4–10.5)
Chloride: 104 mEq/L (ref 96–112)
Creatinine, Ser: 0.66 mg/dL (ref 0.40–1.20)
GFR: 98.29 mL/min (ref >60.00)
GLUCOSE: 116 mg/dL — AB (ref 70–99)
POTASSIUM: 5.1 meq/L (ref 3.5–5.1)
Sodium: 142 mEq/L (ref 135–145)
Total Bilirubin: 0.6 mg/dL (ref 0.2–1.2)
Total Protein: 6.9 g/dL (ref 6.0–8.3)

## 2017-03-21 LAB — VITAMIN D 25 HYDROXY (VIT D DEFICIENCY, FRACTURES): VITD: 21.8 ng/mL — ABNORMAL LOW (ref 30.00–100.00)

## 2017-03-21 LAB — CBC
HCT: 40.3 % (ref 36.0–46.0)
HEMOGLOBIN: 13 g/dL (ref 12.0–15.0)
MCHC: 32.2 g/dL (ref 30.0–36.0)
MCV: 88.6 fl (ref 78.0–100.0)
PLATELETS: 292 10*3/uL (ref 150.0–400.0)
RBC: 4.55 Mil/uL (ref 3.87–5.11)
RDW: 14.2 % (ref 11.5–15.5)
WBC: 6.1 10*3/uL (ref 4.0–10.5)

## 2017-03-21 LAB — MAGNESIUM: Magnesium: 2.4 mg/dL (ref 1.5–2.5)

## 2017-03-21 LAB — TSH: TSH: 3.32 u[IU]/mL (ref 0.35–4.50)

## 2017-03-21 MED ORDER — CYCLOBENZAPRINE HCL 5 MG PO TABS: 5.0000 mg | ORAL_TABLET | Freq: Three times a day (TID) | ORAL | 1 refills | Status: DC | PRN

## 2017-03-21 NOTE — Patient Instructions (Signed)
Great to see you. I will call you with your lab results from today and you can view them online.   Use flexeril as needed for spasms. Please keep me updated.

## 2017-03-21 NOTE — Assessment & Plan Note (Signed)
New- discussed work up for cramping with pt- start with labs today to rule out some common reversible causes. The patient indicates understanding of these issues and agrees with the plan. Orders Placed This Encounter  Procedures  . Tdap vaccine greater than or equal to 57yo IM  . Comprehensive metabolic panel  . CBC  . Magnesium  . Vitamin D (25 hydroxy)  . TSH

## 2017-03-21 NOTE — Assessment & Plan Note (Signed)
New- exam reassuring. SLR neg bilaterally.  Most consistent with spasm.  For acute pain, rest, intermittent application of heat (do not sleep on heating pad), analgesics and muscle relaxants are recommended. Discussed longer term treatment plan of prn NSAID's and discussed a home back care exercise program with flexion exercise routine. Proper lifting with avoidance of heavy lifting discussed. Consider Physical Therapy and XRay studies if not improving. Call or return to clinic prn if these symptoms worsen or fail to improve as anticipated.

## 2017-03-21 NOTE — Progress Notes (Signed)
Subjective:   Patient ID: Renee Walters, female    DOB: 1960-02-24, 57 y.o.   MRN: [id number]  Renee Walters is a pleasant 57 y.o. year old female who presents to clinic today with Leg Problem (Started having cramping primarily in the left calf and sometimes in the right which has been going on x1 month.  Wakes around 3am with it at least 3 times weekly.  She started working out with spouse in January on treadmill and caused swelling and pain in right knee. FYI: Colonoscopy scheduled for 2.18.19 with Dr. Ardis Hughs.) and Sciatica (Patient is also C/O a flare-up of sciatica.  On 2.2.19 she was standing a lot most of the day  on a marle floor. When waking on 2.3.19 her sciatica became bothersome and she usually Tx with muscle relaxants, antiinflammatories, and ice but she does not have any muscle relaxants.  She has been taking 3 ibuprofen 3-4 xqd.  The pain mostly shoots across back but will occas go down right leg to mid-thigh.)  on 03/21/2017  HPI:   Leg cramps- intermittently for a month.  Wakes her up at 3 am at least 3 times weekly. She did start exercising on treadmill in 02/2017 which has caused some swelling and pain in right knee.  Also having some sciatic pain since 03/18/17.  Had been standing on marble floor most of 03/17/17.  Usually treats this with muscle relaxants, NSAIDs and ice but out of muscle relaxants.  Current Outpatient Medications on File Prior to Visit  Medication Sig Dispense Refill  . ibuprofen (ADVIL,MOTRIN) 200 MG tablet Take 600 mg by mouth every 6 (six) hours as needed.    . simvastatin (ZOCOR) 20 MG tablet Take 1 tablet (20 mg total) by mouth at bedtime. 90 tablet 3   No current facility-administered medications on file prior to visit.     Allergies  Allergen Reactions  . Codeine Nausea Only  . Other     Vicryl sutures blisters at site   . Penicillins Other (See Comments)    Passed out as a child    Past Medical History:  Diagnosis Date  . Allergy   .  Asthma   . GERD (gastroesophageal reflux disease)    occ  . Hyperlipidemia   . Hypertension   . Neuromuscular disorder (HCC)    sciatica  . Skin cancer   . WPW (Wolff-Parkinson-White syndrome)    s/p ablation in 1992    Past Surgical History:  Procedure Laterality Date  . ABDOMINAL HYSTERECTOMY    . CARDIAC CATHETERIZATION    . CARDIAC ELECTROPHYSIOLOGY STUDY AND ABLATION    . CHOLECYSTECTOMY    . COLONOSCOPY    . exploratory GYN surgery    . skin cancer removal     several  . TUBAL LIGATION      Family History  Problem Relation Age of Onset  . Arthritis Mother   . COPD Mother   . Diabetes Mother   . Hearing loss Mother   . Hypertension Mother   . Miscarriages / [place] Mother   . Heart disease Father 59       s/p CABG, stents  . Diabetes Father   . Heart attack Father   . Hyperlipidemia Father   . Hypertension Father   . Heart disease Paternal Grandfather   . Yves Dill Parkinson White syndrome Sister   . Cancer Sister   . Diabetes Sister   . Hyperlipidemia Sister   . Hypertension Sister   .  Yves Dill Parkinson White syndrome Unknown        Sister's daughter  . Hyperlipidemia Brother   . Hypertension Brother   . Hyperlipidemia Brother   . Hypertension Brother   . Lung cancer Maternal Grandfather   . Colon polyps Paternal Grandmother   . Colon cancer Neg Hx   . Esophageal cancer Neg Hx   . Rectal cancer Neg Hx   . Stomach cancer Neg Hx     Social History   Socioeconomic History  . Marital status: Married    Spouse name: Not on file  . Number of children: 2  . Years of education: Not on file  . Highest education level: Not on file  Social Needs  . Financial resource strain: Not on file  . Food insecurity - worry: Not on file  . Food insecurity - inability: Not on file  . Transportation needs - medical: Not on file  . Transportation needs - non-medical: Not on file  Occupational History    Employer: [employer]  Tobacco Use  . Smoking status: Never  Smoker  . Smokeless tobacco: Never Used  Substance and Sexual Activity  . Alcohol use: Yes    Frequency: Never    Comment: occasionally  . Drug use: No  . Sexual activity: Not on file  Other Topics Concern  . Not on file  Social History Narrative  . Not on file   The PMH, PSH, Social History, Family History, Medications, and allergies have been reviewed in [facility], and have been updated if relevant.   Review of Systems  Respiratory: Negative.   Cardiovascular: Negative.   Gastrointestinal: Negative.   Musculoskeletal: Positive for back pain and myalgias. Negative for gait problem, joint swelling, neck pain and neck stiffness.  All other systems reviewed and are negative.      Objective:    BP 132/88 (BP Location: Left Arm, Patient Position: Sitting, Cuff Size: Normal)   Pulse 74   Temp 98.5 F (36.9 C) (Oral)   Ht 5\' 1"  (1.549 m)   Wt 181 lb 12.8 oz (82.5 kg)   SpO2 97%   BMI 34.35 kg/m    Physical Exam  Constitutional: She is oriented to person, place, and time. She appears well-developed and well-nourished. No distress.  HENT:  Head: Normocephalic and atraumatic.  Eyes: Conjunctivae are normal.  Neck: Normal range of motion.  Cardiovascular: Normal rate.  Pulmonary/Chest: Effort normal.  Musculoskeletal: Normal range of motion. She exhibits no edema.       Lumbar back: She exhibits pain and spasm. She exhibits normal range of motion, no tenderness, no bony tenderness, no swelling, no edema and no deformity.  Neurological: She is alert and oriented to person, place, and time. No cranial nerve deficit.  Skin: Skin is warm and dry. She is not diaphoretic.  Psychiatric: She has a normal mood and affect. Her behavior is normal. Judgment and thought content normal.  Nursing note and vitals reviewed.         Assessment & Plan:   Leg cramping - Plan: Comprehensive metabolic panel, CBC, Magnesium, Vitamin D (25 hydroxy), TSH  Need for Tdap vaccination - Plan: Tdap  vaccine greater than or equal to 7yo IM  Sciatica, unspecified laterality No Follow-up on file.

## 2017-03-22 ENCOUNTER — Other Ambulatory Visit: Payer: Self-pay | Admitting: Family Medicine

## 2017-03-22 DIAGNOSIS — R252 Cramp and spasm: Secondary | ICD-10-CM

## 2017-03-22 MED ORDER — VITAMIN D3 1.25 MG (50000 UT) PO TABS: 50000.0000 [IU] | ORAL_TABLET | ORAL | 0 refills | Status: DC

## 2017-03-22 NOTE — Progress Notes (Signed)
Lab entered as PCP request.

## 2017-03-22 NOTE — Addendum Note (Signed)
Addended byShawnie Pons on: 03/22/2017 11:25 AM   Modules accepted: Orders

## 2017-04-02 ENCOUNTER — Encounter: Payer: Self-pay | Admitting: Gastroenterology

## 2017-04-02 ENCOUNTER — Ambulatory Visit (AMBULATORY_SURGERY_CENTER): Payer: Commercial Managed Care - PPO | Admitting: Gastroenterology

## 2017-04-02 VITALS — BP 156/76 | HR 73 | Temp 97.5°F | Resp 14 | Ht 61.0 in | Wt 181.0 lb

## 2017-04-02 DIAGNOSIS — I10 Essential (primary) hypertension: Secondary | ICD-10-CM | POA: Diagnosis not present

## 2017-04-02 DIAGNOSIS — D124 Benign neoplasm of descending colon: Secondary | ICD-10-CM

## 2017-04-02 DIAGNOSIS — D123 Benign neoplasm of transverse colon: Secondary | ICD-10-CM

## 2017-04-02 DIAGNOSIS — Z1211 Encounter for screening for malignant neoplasm of colon: Secondary | ICD-10-CM | POA: Diagnosis present

## 2017-04-02 DIAGNOSIS — J45909 Unspecified asthma, uncomplicated: Secondary | ICD-10-CM | POA: Diagnosis not present

## 2017-04-02 MED ORDER — SODIUM CHLORIDE 0.9 % IV SOLN: 500.0000 mL | Freq: Once | INTRAVENOUS | Status: AC

## 2017-04-02 NOTE — Op Note (Signed)
[facility] Patient Name: Renee Walters Procedure Date: 04/02/2017 7:51 AM MRN: [id number] Endoscopist: Milus Banister , MD Age: 57 Referring MD:  Date of Birth: 06-18-1960 Gender: Female Account #: [id number] Procedure:                Colonoscopy Indications:              Screening for colorectal malignant neoplasm Medicines:                Monitored Anesthesia Care Procedure:                Pre-Anesthesia Assessment:                           - Prior to the procedure, a History and Physical                            was performed, and patient medications and                            allergies were reviewed. The patient's tolerance of                            previous anesthesia was also reviewed. The risks                            and benefits of the procedure and the sedation                            options and risks were discussed with the patient.                            All questions were answered, and informed consent                            was obtained. Prior Anticoagulants: The patient has                            taken no previous anticoagulant or antiplatelet                            agents. ASA Grade Assessment: II - A patient with                            mild systemic disease. After reviewing the risks                            and benefits, the patient was deemed in                            satisfactory condition to undergo the procedure.                           After obtaining informed consent, the colonoscope  was passed under direct vision. Throughout the                            procedure, the patient's blood pressure, pulse, and                            oxygen saturations were monitored continuously. The                            Colonoscope was introduced through the anus and                            advanced to the the cecum, identified by                            appendiceal orifice and  ileocecal valve. The                            colonoscopy was performed without difficulty. The                            patient tolerated the procedure well. The quality                            of the bowel preparation was good. The ileocecal                            valve, appendiceal orifice, and rectum were                            photographed. Scope In: 7:59:41 AM Scope Out: 8:12:56 AM Scope Withdrawal Time: 0 hours 7 minutes 2 seconds  Total Procedure Duration: 0 hours 13 minutes 15 seconds  Findings:                 A 2 mm polyp was found in the descending colon. The                            polyp was sessile. The polyp was removed with a                            cold snare. Resection and retrieval were complete.                           The exam was otherwise without abnormality on                            direct and retroflexion views. Complications:            No immediate complications. Estimated blood loss:                            None. Estimated Blood Loss:     Estimated blood loss: none. Impression:               -  One 2 mm polyp in the descending colon, removed                            with a cold snare. Resected and retrieved.                           - The examination was otherwise normal on direct                            and retroflexion views. Recommendation:           - Patient has a contact number available for                            emergencies. The signs and symptoms of potential                            delayed complications were discussed with the                            patient. Return to normal activities tomorrow.                            Written discharge instructions were provided to the                            patient.                           - Resume previous diet.                           - Continue present medications.                           You will receive a letter within 2-3 weeks with the                             pathology results and my final recommendations.                           If the polyp(s) is proven to be 'pre-cancerous' on                            pathology, you will need repeat colonoscopy in 5                            years. If the polyp(s) is NOT 'precancerous' on                            pathology then you should repeat colon cancer                            screening in 10 years with colonoscopy without need  for colon cancer screening by any method prior to                            then (including stool testing). Milus Banister, MD 04/02/2017 8:15:16 AM This report has been signed electronically.

## 2017-04-02 NOTE — Progress Notes (Signed)
To recovery, repoirt to RN, VSS

## 2017-04-02 NOTE — Progress Notes (Signed)
Pt's states no medical or surgical changes since previsit or office visit. 

## 2017-04-02 NOTE — Progress Notes (Signed)
Called to room to assist during endoscopic procedure.  Patient ID and intended procedure confirmed with present staff. Received instructions for my participation in the procedure from the performing physician.  

## 2017-04-02 NOTE — Patient Instructions (Signed)
YOU HAD AN ENDOSCOPIC PROCEDURE TODAY AT [facility]:   Refer to the procedure report that was given to you for any specific questions about what was found during the examination.  If the procedure report does not answer your questions, please call your gastroenterologist to clarify.  If you requested that your care partner not be given the details of your procedure findings, then the procedure report has been included in a sealed envelope for you to review at your convenience later.  YOU SHOULD EXPECT: Some feelings of bloating in the abdomen. Passage of more gas than usual.  Walking can help get rid of the air that was put into your GI tract during the procedure and reduce the bloating. If you had a lower endoscopy (such as a colonoscopy or flexible sigmoidoscopy) you may notice spotting of blood in your stool or on the toilet paper. If you underwent a bowel prep for your procedure, you may not have a normal bowel movement for a few days.  Please Note:  You might notice some irritation and congestion in your nose or some drainage.  This is from the oxygen used during your procedure.  There is no need for concern and it should clear up in a day or so.  SYMPTOMS TO REPORT IMMEDIATELY:   Following lower endoscopy (colonoscopy or flexible sigmoidoscopy):  Excessive amounts of blood in the stool  Significant tenderness or worsening of abdominal pains  Swelling of the abdomen that is new, acute  Fever of 100F or higher  Please see handout given to you on polyps.  For urgent or emergent issues, a gastroenterologist can be reached at any hour by calling [phone].   DIET:  We do recommend a small meal at first, but then you may proceed to your regular diet.  Drink plenty of fluids but you should avoid alcoholic beverages for 24 hours.  ACTIVITY:  You should plan to take it easy for the rest of today and you should NOT DRIVE or use heavy machinery until tomorrow (because of the  sedation medicines used during the test).    FOLLOW UP: Our staff will call the number listed on your records the next business day following your procedure to check on you and address any questions or concerns that you may have regarding the information given to you following your procedure. If we do not reach you, we will leave a message.  However, if you are feeling well and you are not experiencing any problems, there is no need to return our call.  We will assume that you have returned to your regular daily activities without incident.  If any biopsies were taken you will be contacted by phone or by letter within the next 1-3 weeks.  Please call us at [phone] if you have not heard about the biopsies in 3 weeks.    SIGNATURES/CONFIDENTIALITY: You and/or your care partner have signed paperwork which will be entered into your electronic medical record.  These signatures attest to the fact that that the information above on your After Visit Summary has been reviewed and is understood.  Full responsibility of the confidentiality of this discharge information lies with you and/or your care-partner.  Thank you for letting us take care of your healthcare needs today.

## 2017-04-03 ENCOUNTER — Telehealth: Payer: Self-pay | Admitting: *Deleted

## 2017-04-03 NOTE — Telephone Encounter (Signed)
  Follow up Call-  Call back number 04/02/2017  Post procedure Call Back phone  # [phone]  Permission to leave phone message Yes  Some recent data might be hidden     Patient questions:  Do you have a fever, pain , or abdominal swelling? No. Pain Score  0 *  Have you tolerated food without any problems? Yes.    Have you been able to return to your normal activities? Yes.    Do you have any questions about your discharge instructions: Diet   No. Medications  No. Follow up visit  No.  Do you have questions or concerns about your Care? No.  Actions: * If pain score is 4 or above: No action needed, pain <4.

## 2017-04-06 ENCOUNTER — Encounter: Payer: Self-pay | Admitting: Gastroenterology

## 2017-06-18 ENCOUNTER — Encounter: Payer: Self-pay | Admitting: Family Medicine

## 2017-06-18 ENCOUNTER — Other Ambulatory Visit: Payer: Self-pay | Admitting: Family Medicine

## 2017-06-18 MED ORDER — ALBUTEROL SULFATE HFA 108 (90 BASE) MCG/ACT IN AERS: 2.0000 | INHALATION_SPRAY | Freq: Four times a day (QID) | RESPIRATORY_TRACT | 0 refills | Status: DC | PRN

## 2017-06-27 ENCOUNTER — Encounter: Payer: Self-pay | Admitting: Family Medicine

## 2017-06-28 ENCOUNTER — Ambulatory Visit: Payer: Self-pay

## 2017-06-28 NOTE — Telephone Encounter (Signed)
Pt c/o heart "quivering" for the last month. She denies any weakness, SOB, chest pain,dizziness,or difficulty breathing.  Pt states the quivering occurs intermittently and does not worsen with exertion. Pt states she does not drink alcohol, caffeine, smoke cigarettes or take illicit drugs.  Pt has a h/o of hyperthyroidism but is currently not being treated for it.  Pt with h/o [facility] and a left bundle branch block. Pt h/o cardiac ablation back in 1992 at [facility]. Was followed by Dr. Minus Breeding but was released from care 11/05/14 with instruction to contact him for any further problems. Pt asking for referral back with Dr Percival Spanish for holter monitor. Care advice given. Appt made for pt with Dr Ethelene Hal (Dr Deborra Medina not on schedule.)  Reason for Disposition . History of hyperthyroidism or taking thyroid medication  Answer Assessment - Initial Assessment Questions 1. DESCRIPTION: "Please describe your heart rate or heart beat that you are having" (e.g., fast/slow, regular/irregular, skipped or extra beats, "palpitations")     Pt feels a quivering feeling in chest 2. ONSET: "When did it start?" (Minutes, hours or days)      1 month ago 3. DURATION: "How long does it last" (e.g., seconds, minutes, hours)     Varies  Having it now but did not have feeling 10 minutes ago 4. PATTERN "Does it come and go, or has it been constant since it started?"  "Does it get worse with exertion?"   "Are you feeling it now?"     Comes and goes- doesn't get worse with exertion-feeling it now 5. TAP: "Using your hand, can you tap out what you are feeling on a chair or table in front of you, so that I can hear?" (Note: not all patients can do this)       Not the heart rate but it feels like the heart is quviering 6. HEART RATE: "Can you tell me your heart rate?" "How many beats in 15 seconds?"  (Note: not all patients can do this)       Pt cannot do this 7. RECURRENT SYMPTOM: "Have you ever had this before?"  If so, ask: "When was the last time?" and "What happened that time?"      Yes-1991- had ablation at [facility]. CAUSE: "What do you think is causing the palpitations?"     Pt does not know 9. CARDIAC HISTORY: "Do you have any history of heart disease?" (e.g., heart attack, angina, bypass surgery, angioplasty, arrhythmia)      Wolfe Parkinson White, Left bundle branch block, ablasion in 1992 10. OTHER SYMPTOMS: "Do you have any other symptoms?" (e.g., dizziness, chest pain, sweating, difficulty breathing)       no 11. PREGNANCY: "Is there any chance you are pregnant?" "When was your last menstrual period?"       n/a  Protocols used: HEART RATE AND HEARTBEAT QUESTIONS-A-AH

## 2017-06-29 ENCOUNTER — Ambulatory Visit (INDEPENDENT_AMBULATORY_CARE_PROVIDER_SITE_OTHER): Payer: Commercial Managed Care - PPO | Admitting: Cardiology

## 2017-06-29 ENCOUNTER — Ambulatory Visit (INDEPENDENT_AMBULATORY_CARE_PROVIDER_SITE_OTHER): Payer: Commercial Managed Care - PPO

## 2017-06-29 ENCOUNTER — Encounter: Payer: Self-pay | Admitting: Cardiology

## 2017-06-29 ENCOUNTER — Encounter: Payer: Self-pay | Admitting: Family Medicine

## 2017-06-29 ENCOUNTER — Ambulatory Visit (INDEPENDENT_AMBULATORY_CARE_PROVIDER_SITE_OTHER): Payer: Commercial Managed Care - PPO | Admitting: Family Medicine

## 2017-06-29 VITALS — BP 142/90 | HR 75 | Ht 61.0 in | Wt 182.0 lb

## 2017-06-29 VITALS — BP 142/96 | HR 87 | Ht 61.0 in | Wt 184.2 lb

## 2017-06-29 DIAGNOSIS — E7800 Pure hypercholesterolemia, unspecified: Secondary | ICD-10-CM | POA: Diagnosis not present

## 2017-06-29 DIAGNOSIS — I456 Pre-excitation syndrome: Secondary | ICD-10-CM | POA: Diagnosis not present

## 2017-06-29 DIAGNOSIS — R002 Palpitations: Secondary | ICD-10-CM

## 2017-06-29 NOTE — Progress Notes (Signed)
Cardiology Office Note   Date:  06/29/2017   ID:  [name], DOB 08/26/1960, MRN [id number]  PCP:  Lucille Passy, MD  Cardiologist:   No primary care provider on file. Referring:  Lucille Passy, MD   Chief Complaint  Patient presents with  . Palpitations      History of Present Illness: Renee Walters is a 57 y.o. female who presents for evaluation of palpitations.  To my schedule.  She has a history of palpitations with WPW ablation in 1992. This was done at [facility].    She has not been bothered by the symptoms of WPW in some time but she has had occasional "skipped beats.  However, she is now been having increasing symptoms.  She feels like her heart is "quivering".  She will feel skipped beats but she has a more intense sensation associated with lightheadedness.  She describes almost a spasm.  She is not had any syncope.  She is not able to bring this on.  It happens at rest.  She is not describing substernal chest discomfort, neck or arm discomfort.  She is not having any PND or orthopnea.  She is had no weight gain or edema.  Past Medical History:  Diagnosis Date  . Allergy   . Asthma   . GERD (gastroesophageal reflux disease)    occ  . Hyperlipidemia   . Hypertension   . Neuromuscular disorder (HCC)    sciatica  . Skin cancer   . WPW (Wolff-Parkinson-White syndrome)    s/p ablation in 1992    Past Surgical History:  Procedure Laterality Date  . ABDOMINAL HYSTERECTOMY    . CARDIAC CATHETERIZATION    . CARDIAC ELECTROPHYSIOLOGY STUDY AND ABLATION    . CHOLECYSTECTOMY    . COLONOSCOPY    . exploratory GYN surgery    . skin cancer removal     several  . TUBAL LIGATION       Current Outpatient Medications  Medication Sig Dispense Refill  . albuterol (PROVENTIL HFA;VENTOLIN HFA) 108 (90 Base) MCG/ACT inhaler Inhale 2 puffs into the lungs every 6 (six) hours as needed. 1 Inhaler 0  . cetirizine (ZYRTEC) 10 MG tablet Take 10 mg by mouth daily.    .  Cholecalciferol (VITAMIN D3) 50000 units TABS Take 50,000 Units by mouth once a week. 1 tab po weekly for 6 weeks. 6 tablet 0  . cyclobenzaprine (FLEXERIL) 5 MG tablet Take 1 tablet (5 mg total) by mouth 3 (three) times daily as needed for muscle spasms. 30 tablet 1  . ibuprofen (ADVIL,MOTRIN) 200 MG tablet Take 600 mg by mouth every 6 (six) hours as needed.    . simvastatin (ZOCOR) 20 MG tablet Take 1 tablet (20 mg total) by mouth at bedtime. 90 tablet 3   Current Facility-Administered Medications  Medication Dose Route Frequency Provider Last Rate Last Dose  . 0.9 %  sodium chloride infusion  500 mL Intravenous Once Milus Banister, MD        Allergies:   Codeine; Other; and Penicillins    ROS:  Please see the history of present illness.   Otherwise, review of systems are positive for fatigue.   All other systems are reviewed and negative.    PHYSICAL EXAM: VS:  BP (!) 142/90 (BP Location: Left Arm, Patient Position: Sitting, Cuff Size: Large)   Pulse 75   Ht 5\' 1"  (1.549 m)   Wt 182 lb (82.6 kg)   BMI  34.39 kg/m  , BMI Body mass index is 34.39 kg/m. GENERAL:  Well appearing HEENT:  Pupils equal round and reactive, fundi not visualized, oral mucosa unremarkable NECK:  No jugular venous distention, waveform within normal limits, carotid upstroke brisk and symmetric, no bruits, no thyromegaly LYMPHATICS:  No cervical, inguinal adenopathy LUNGS:  Clear to auscultation bilaterally BACK:  No CVA tenderness CHEST:  Unremarkable HEART:  PMI not displaced or sustained,S1 and S2 within normal limits, no S3, no S4, no clicks, no rubs, no murmurs ABD:  Flat, positive bowel sounds normal in frequency in pitch, no bruits, no rebound, no guarding, no midline pulsatile mass, no hepatomegaly, no splenomegaly EXT:  2 plus pulses throughout, no edema, no cyanosis no clubbing SKIN:  No rashes no nodules NEURO:  Cranial nerves II through XII grossly intact, motor grossly intact throughout PSYCH:   Cognitively intact, oriented to person place and time    EKG:  EKG is not ordered today. The ekg ordered 07/01/17 demonstrates sinus rhythm, rate 88, interventricular conduction delay, left axis.  This is not changed from previous.   Recent Labs: 03/21/2017: ALT 20; BUN 15; Creatinine, Ser 0.66; Hemoglobin 13.0; Magnesium 2.4; Platelets 292.0; Potassium 5.1; Sodium 142; TSH 3.32    Lipid Panel    Component Value Date/Time   CHOL 270 (H) 01/02/2017 0829   TRIG 240.0 (H) 01/02/2017 0829   HDL 67.60 01/02/2017 0829   CHOLHDL 4 01/02/2017 0829   VLDL 48.0 (H) 01/02/2017 0829   LDLCALC 178 (H) 11/16/2014 0824   LDLDIRECT 165.0 01/02/2017 0829      Wt Readings from Last 3 Encounters:  06/29/17 182 lb (82.6 kg)  06/29/17 184 lb 4 oz (83.6 kg)  04/02/17 181 lb (82.1 kg)      Other studies Reviewed: Additional studies/ records that were reviewed today include: Labs. Review of the above records demonstrates:  Please see elsewhere in the note.     ASSESSMENT AND PLAN:  PALPITATIONS:   I did look her up to an EKG machine where she was describing some of the palpitations and she had some fairly frequent premature atrial contractions.  However, this did not capture the sustained "quivering sensation".  She is going to get a 48-hour Holter today.  Further evaluation based on these results.  I do note that she has had recent normal electrolytes and TSH.   Current medicines are reviewed at length with the patient today.  The patient does not have concerns regarding medicines.  The following changes have been made:  no change  Labs/ tests ordered today include:   Orders Placed This Encounter  Procedures  . HOLTER MONITOR - 48 HOUR     Disposition:   FU with as needed based on the results of the above.     Signed, Minus Breeding, MD  06/29/2017 4:15 PM    [facility]

## 2017-06-29 NOTE — Patient Instructions (Addendum)
Your physician has recommended that you wear a 48-hour holter monitor. Holter monitors are medical devices that record the heart's electrical activity. Doctors most often use these monitors to diagnose arrhythmias. Arrhythmias are problems with the speed or rhythm of the heartbeat. The monitor is a small, portable device. You can wear one while you do your normal daily activities. This is usually used to diagnose what is causing palpitations/syncope (passing out). >>This will be placed at our [facility] location [place], [place] [address] [phone]  Dr Percival Spanish recommends that you follow-up with him as needed.   Kardia device - [email or web address]

## 2017-06-29 NOTE — Progress Notes (Signed)
Subjective:  Patient ID: Renee Walters, female    DOB: Aug 20, 1960  Age: 57 y.o. MRN: [id number]  CC: heart quivering (patient use to see cardiology, but needs a new referral. denies any weakness, SOB, chest pain,dizziness,or difficulty breathing. )   HPI Renee Walters presents for evaluation of palpitations she is been experiencing on a daily basis.  She says that these can last from minutes to hours.  She describes them as a quivering of her heart and occasionally she feels her heart turn over in her chest.  She says that these are not particularly associated with nausea vomiting, shortness of breath DOE or chest pain.  Past medical history of Wolff-Parkinson-White.  Status post ablation for chronic tachycardia.  History of elevated blood pressure is currently untreated.  She is taking Zocor for elevated LDL cholesterol.  Patient denies snoring and does feel rested in the morning.  To her knowledge she has not experienced any apneic episodes.    Outpatient Medications Prior to Visit  Medication Sig Dispense Refill  . albuterol (PROVENTIL HFA;VENTOLIN HFA) 108 (90 Base) MCG/ACT inhaler Inhale 2 puffs into the lungs every 6 (six) hours as needed. 1 Inhaler 0  . cetirizine (ZYRTEC) 10 MG tablet Take 10 mg by mouth daily.    . Cholecalciferol (VITAMIN D3) 50000 units TABS Take 50,000 Units by mouth once a week. 1 tab po weekly for 6 weeks. 6 tablet 0  . cyclobenzaprine (FLEXERIL) 5 MG tablet Take 1 tablet (5 mg total) by mouth 3 (three) times daily as needed for muscle spasms. 30 tablet 1  . ibuprofen (ADVIL,MOTRIN) 200 MG tablet Take 600 mg by mouth every 6 (six) hours as needed.    . simvastatin (ZOCOR) 20 MG tablet Take 1 tablet (20 mg total) by mouth at bedtime. 90 tablet 3   Facility-Administered Medications Prior to Visit  Medication Dose Route Frequency Provider Last Rate Last Dose  . 0.9 %  sodium chloride infusion  500 mL Intravenous Once Milus Banister, MD        ROS Review of  Systems  Constitutional: Negative for chills, diaphoresis, fatigue, fever and unexpected weight change.  HENT: Negative.   Eyes: Negative.   Respiratory: Positive for cough. Negative for chest tightness, shortness of breath and wheezing.   Cardiovascular: Positive for palpitations. Negative for chest pain and leg swelling.  Gastrointestinal: Negative.   Endocrine: Negative for cold intolerance and heat intolerance.  Genitourinary: Negative.   Musculoskeletal: Negative.   Skin: Negative.   Allergic/Immunologic: Negative for immunocompromised state.  Neurological: Negative for weakness, numbness and headaches.  Hematological: Does not bruise/bleed easily.  Psychiatric/Behavioral: Negative.     Objective:  BP (!) 142/96   Pulse 87   Ht 5\' 1"  (1.549 m)   Wt 184 lb 4 oz (83.6 kg)   SpO2 97%   BMI 34.81 kg/m   BP Readings from Last 3 Encounters:  06/29/17 (!) 142/96  04/02/17 (!) 156/76  03/21/17 132/88    Wt Readings from Last 3 Encounters:  06/29/17 184 lb 4 oz (83.6 kg)  04/02/17 181 lb (82.1 kg)  03/21/17 181 lb 12.8 oz (82.5 kg)    Physical Exam  Constitutional: She is oriented to person, place, and time. She appears well-developed and well-nourished. No distress.  HENT:  Head: Normocephalic.  Right Ear: External ear normal.  Left Ear: External ear normal.  Mouth/Throat: Oropharynx is clear and moist. No oropharyngeal exudate.    Eyes: Pupils are equal, round, and  reactive to light. Conjunctivae and EOM are normal. Right eye exhibits no discharge. Left eye exhibits no discharge. No scleral icterus.  Neck: Normal range of motion. Neck supple. No JVD present. No tracheal deviation present. No thyromegaly present.  Cardiovascular: Normal rate, regular rhythm and normal heart sounds.  No extrasystoles.   Brief run of irregularity with rhythm.   Pulmonary/Chest: Effort normal and breath sounds normal.  Abdominal: Soft. Bowel sounds are normal.  Musculoskeletal: She  exhibits no edema.  Lymphadenopathy:    She has no cervical adenopathy.  Neurological: She is alert and oriented to person, place, and time.  Skin: Skin is warm and dry. No rash noted. She is not diaphoretic. No erythema. No pallor.  Psychiatric: She has a normal mood and affect. Her behavior is normal.    Lab Results  Component Value Date   WBC 6.1 03/21/2017   HGB 13.0 03/21/2017   HCT 40.3 03/21/2017   PLT 292.0 03/21/2017   GLUCOSE 116 (H) 03/21/2017   CHOL 270 (H) 01/02/2017   TRIG 240.0 (H) 01/02/2017   HDL 67.60 01/02/2017   LDLDIRECT 165.0 01/02/2017   LDLCALC 178 (H) 11/16/2014   ALT 20 03/21/2017   AST 16 03/21/2017   NA 142 03/21/2017   K 5.1 03/21/2017   CL 104 03/21/2017   CREATININE 0.66 03/21/2017   BUN 15 03/21/2017   CO2 31 03/21/2017   TSH 3.32 03/21/2017    Mm Digital Screening Bilateral  Result Date: 03/16/2017 CLINICAL DATA:  Screening. EXAM: DIGITAL SCREENING BILATERAL MAMMOGRAM WITH CAD COMPARISON:  Previous exam(s). ACR Breast Density Category a: The breast tissue is almost entirely fatty. FINDINGS: There are no findings suspicious for malignancy. Images were processed with CAD. IMPRESSION: No mammographic evidence of malignancy. A result letter of this screening mammogram will be mailed directly to the patient. RECOMMENDATION: Screening mammogram in one year. (Code:SM-B-01Y) BI-RADS CATEGORY  1: Negative. Electronically Signed   By: Dorise Bullion III M.D   On: 03/16/2017 15:37    Assessment & Plan:   Renee Walters was seen today for heart quivering.  Diagnoses and all orders for this visit:  Palpitation -     EKG 12-Lead -     Ambulatory referral to Cardiology  WPW (Wolff-Parkinson-White syndrome) -     Ambulatory referral to Cardiology  Elevated LDL cholesterol level   I am having Renee Walters maintain her simvastatin, ibuprofen, cyclobenzaprine, Vitamin D3, albuterol, and cetirizine. We will continue to administer sodium chloride.  No  orders of the defined types were placed in this encounter.  Believe that the patient is possibly experiencing brief periods of A. fib.  EKG obtained today did show regular rhythm.  It was essentially unchanged from an EKG that was obtained 3 years ago.  I have asked for an urgent  cardiology referral.  She will start aspirin therapy.  She will go to the emergency room with any chest pain shortness of breath nausea or vomiting dyspnea on exertion. Follow-up: No follow-ups on file.  Libby Maw, MD

## 2017-07-05 ENCOUNTER — Telehealth: Payer: Self-pay | Admitting: Cardiology

## 2017-07-05 NOTE — Telephone Encounter (Signed)
New Message:   Pt said she got a notification on  My-Chart that she had test results. She could not see any results on My-Chart. She wants to know if her monitor results are ready?

## 2017-07-05 NOTE — Telephone Encounter (Signed)
Returned call to patient, advised monitor results need to be reviewed by Dr. Percival Spanish.  Patient aware and verbalized understanding.

## 2017-07-06 ENCOUNTER — Telehealth: Payer: Self-pay

## 2017-07-06 NOTE — Telephone Encounter (Signed)
Spoke with pt and she is waiting on results from her cardiologist. I informed her I did not have those results to go over with her and said she needed to call that dr office to better assist her. TLG

## 2017-07-06 NOTE — Telephone Encounter (Signed)
Tried calling pt to ask what questions she had about her Mychart update but was unable to leave a VM. TLGCopied from [place] [phone]. Topic: Quick Communication - See Telephone Encounter >> Jul 05, 2017 12:19 PM Antonieta Iba C wrote: CRM for notification. See Telephone encounter for: 07/05/17.  Pt says that she had a update on my-chart but is not showing an update. Pt would like to be sure that she is not missing anything.

## 2017-07-10 ENCOUNTER — Telehealth: Payer: Self-pay | Admitting: Cardiology

## 2017-07-10 NOTE — Telephone Encounter (Signed)
New message    Patient calling for monitor results. Please call at work.

## 2017-07-10 NOTE — Telephone Encounter (Signed)
Pt aware of her Holter monitor appt made 05/30 @ 3

## 2017-07-12 NOTE — Progress Notes (Signed)
Cardiology Office Note   Date:  07/13/2017   ID:  Renee Walters, DOB Feb 20, 1960, MRN [id number]  PCP:  Lucille Passy, MD  Cardiologist:   No primary care provider on file. Referring:  Lucille Passy, MD   Chief Complaint  Patient presents with  . Palpitations      History of Present Illness: Renee Walters is a 57 y.o. female who presents for evaluation of palpitations.  She has a history of palpitations with WPW ablation in 1992. This was done at [facility].     She presented for evaluation of palpitations.  She wore a monitor. There are no sustained arrhythmias other than runs of bigeminy. There are brief runs of atrial tach that are not reported with symptoms.  We reviewed the monitor.  10% of the beats were premature beats ventricular and then there were supraventricular beats as well.  Sitting in the office she was feeling the sensation that she was describing and she was clearly in some form of trigeminy when I was listening to her.  Later when she changed positions on the table this seemed to abate clinically as well as by exam.  She feels like she cannot take a deep breath when this is happening.  She has not had any frank syncope or presyncope.  She has not been exercising.  She cannot bring this on.  She is not describing new chest pressure, neck or arm discomfort.   Past Medical History:  Diagnosis Date  . Allergy   . Asthma   . GERD (gastroesophageal reflux disease)    occ  . Hyperlipidemia   . Hypertension   . Neuromuscular disorder (HCC)    sciatica  . Skin cancer   . WPW (Wolff-Parkinson-White syndrome)    s/p ablation in 1992    Past Surgical History:  Procedure Laterality Date  . ABDOMINAL HYSTERECTOMY    . CARDIAC CATHETERIZATION    . CARDIAC ELECTROPHYSIOLOGY STUDY AND ABLATION    . CHOLECYSTECTOMY    . COLONOSCOPY    . exploratory GYN surgery    . skin cancer removal     several  . TUBAL LIGATION       Current Outpatient Medications    Medication Sig Dispense Refill  . albuterol (PROVENTIL HFA;VENTOLIN HFA) 108 (90 Base) MCG/ACT inhaler Inhale 2 puffs into the lungs every 6 (six) hours as needed. 1 Inhaler 0  . cetirizine (ZYRTEC) 10 MG tablet Take 10 mg by mouth daily.    . Cholecalciferol (VITAMIN D3) 50000 units TABS Take 50,000 Units by mouth once a week. 1 tab po weekly for 6 weeks. 6 tablet 0  . cyclobenzaprine (FLEXERIL) 5 MG tablet Take 1 tablet (5 mg total) by mouth 3 (three) times daily as needed for muscle spasms. 30 tablet 1  . ibuprofen (ADVIL,MOTRIN) 200 MG tablet Take 600 mg by mouth every 6 (six) hours as needed.    . simvastatin (ZOCOR) 20 MG tablet Take 1 tablet (20 mg total) by mouth at bedtime. 90 tablet 3  . metoprolol tartrate (LOPRESSOR) 25 MG tablet Take 1 tablet (25 mg total) by mouth 3 (three) times daily. 90 tablet 11   Current Facility-Administered Medications  Medication Dose Route Frequency Provider Last Rate Last Dose  . 0.9 %  sodium chloride infusion  500 mL Intravenous Once Milus Banister, MD        Allergies:   Codeine; Other; and Penicillins    ROS:  Please see the  history of present illness.   Otherwise, review of systems are positive for none   All other systems are reviewed and negative.    PHYSICAL EXAM: VS:  BP (!) 159/86   Pulse 83   Ht 5\' 1"  (1.549 m)   Wt 184 lb 3.2 oz (83.6 kg)   SpO2 96%   BMI 34.80 kg/m  , BMI Body mass index is 34.8 kg/m.  GENERAL:  Well appearing NECK:  No jugular venous distention, waveform within normal limits, carotid upstroke brisk and symmetric, no bruits, no thyromegaly LUNGS:  Clear to auscultation bilaterally CHEST:  Unremarkable HEART:  PMI not displaced or sustained,S1 and S2 within normal limits, no S3, no S4, no clicks, no rubs, no murmurs ABD:  Flat, positive bowel sounds normal in frequency in pitch, no bruits, no rebound, no guarding, no midline pulsatile mass, no hepatomegaly, no splenomegaly EXT:  2 plus pulses throughout,  trace edema, no cyanosis no clubbing   EKG:  EKG is not ordered today.    Recent Labs: 03/21/2017: ALT 20; BUN 15; Creatinine, Ser 0.66; Hemoglobin 13.0; Magnesium 2.4; Platelets 292.0; Potassium 5.1; Sodium 142; TSH 3.32    Lipid Panel    Component Value Date/Time   CHOL 270 (H) 01/02/2017 0829   TRIG 240.0 (H) 01/02/2017 0829   HDL 67.60 01/02/2017 0829   CHOLHDL 4 01/02/2017 0829   VLDL 48.0 (H) 01/02/2017 0829   LDLCALC 178 (H) 11/16/2014 0824   LDLDIRECT 165.0 01/02/2017 0829      Wt Readings from Last 3 Encounters:  07/13/17 184 lb 3.2 oz (83.6 kg)  06/29/17 182 lb (82.6 kg)  06/29/17 184 lb 4 oz (83.6 kg)      Other studies Reviewed: Additional studies/ records that were reviewed today include: Labs. Review of the above records demonstrates:  Please see elsewhere in the note.     ASSESSMENT AND PLAN:  PALPITATIONS:   She is clearly feeling premature beats.  We need to treat this symptomatically.  She is had normal electrolytes and TSH.  I am going to check an echocardiogram given the breathlessness and frequency of the ectopy.  I am going to start metoprolol 25 mg 3 times daily.  I will make further adjustments based on symptoms and eventually like to consolidate this regimen.  HTN:  This will be treated in the context of treating the palpitations.    Current medicines are reviewed at length with the patient today.  The patient does not have concerns regarding medicines.  The following changes have been made:  As above  Labs/ tests ordered today include:    Orders Placed This Encounter  Procedures  . ECHOCARDIOGRAM COMPLETE     Disposition:   FU me in six weeks.      Signed, Minus Breeding, MD  07/13/2017 4:15 PM     [facility]

## 2017-07-13 ENCOUNTER — Encounter: Payer: Self-pay | Admitting: Cardiology

## 2017-07-13 ENCOUNTER — Ambulatory Visit (INDEPENDENT_AMBULATORY_CARE_PROVIDER_SITE_OTHER): Payer: Commercial Managed Care - PPO | Admitting: Cardiology

## 2017-07-13 VITALS — BP 159/86 | HR 83 | Ht 61.0 in | Wt 184.2 lb

## 2017-07-13 DIAGNOSIS — R002 Palpitations: Secondary | ICD-10-CM | POA: Diagnosis not present

## 2017-07-13 MED ORDER — METOPROLOL TARTRATE 25 MG PO TABS: 25.0000 mg | ORAL_TABLET | Freq: Three times a day (TID) | ORAL | 11 refills | Status: DC

## 2017-07-13 NOTE — Patient Instructions (Signed)
Medication Instructions:  START- Metoprolol 25 mg three times a day  If you need a refill on your cardiac medications before your next appointment, please call your pharmacy.  Labwork: None Ordered   Testing/Procedures: Your physician has requested that you have an echocardiogram. Echocardiography is a painless test that uses sound waves to create images of your heart. It provides your doctor with information about the size and shape of your heart and how well your heart's chambers and valves are working. This procedure takes approximately one hour. There are no restrictions for this procedure.   Follow-Up: Your physician wants you to follow-up in: 6 Weeks.       Thank you for choosing [facility] at [facility]!!

## 2017-07-16 ENCOUNTER — Encounter: Payer: Self-pay | Admitting: Cardiology

## 2017-07-18 ENCOUNTER — Ambulatory Visit (HOSPITAL_COMMUNITY): Payer: Commercial Managed Care - PPO | Attending: Cardiology

## 2017-07-18 ENCOUNTER — Other Ambulatory Visit: Payer: Self-pay

## 2017-07-18 DIAGNOSIS — I340 Nonrheumatic mitral (valve) insufficiency: Secondary | ICD-10-CM | POA: Insufficient documentation

## 2017-07-18 DIAGNOSIS — I313 Pericardial effusion (noninflammatory): Secondary | ICD-10-CM | POA: Insufficient documentation

## 2017-07-18 DIAGNOSIS — I10 Essential (primary) hypertension: Secondary | ICD-10-CM | POA: Diagnosis not present

## 2017-07-18 DIAGNOSIS — R002 Palpitations: Secondary | ICD-10-CM | POA: Diagnosis not present

## 2017-07-18 DIAGNOSIS — E785 Hyperlipidemia, unspecified: Secondary | ICD-10-CM | POA: Insufficient documentation

## 2017-07-22 ENCOUNTER — Encounter: Payer: Self-pay | Admitting: Cardiology

## 2017-07-23 ENCOUNTER — Encounter: Payer: Self-pay | Admitting: Cardiology

## 2017-07-23 NOTE — Telephone Encounter (Signed)
-----   Message from Minus Breeding, MD sent at 07/22/2017  6:17 PM EDT ----- I would like to see her on Thursday.

## 2017-07-25 NOTE — Progress Notes (Signed)
Cardiology Office Note   Date:  07/26/2017   ID:  MCKINLEY ADELSTEIN, DOB November 28, 1960, MRN [id number]  PCP:  Lucille Passy, MD  Cardiologist:   No primary care provider on file. Referring:  Lucille Passy, MD   Chief Complaint  Patient presents with  . Palpitations      History of Present Illness: Renee Walters is a 57 y.o. female who presents for evaluation of palpitations.  She has a history of palpitations with WPW ablation in 1992. This was done at [facility].    In May she presented for evaluation of palpitations.  She wore a monitor. There are no sustained arrhythmias other than runs of bigeminy. There are brief runs of atrial tach that are not reported with symptoms.  We reviewed the monitor.  10% of the beats were premature beats ventricular and then there were supraventricular beats as well.  I started her on beta blockers and she called and reported that she has had improvement in symptoms.  An echo was ordered and it demonstrated a reduced EF of 25%.  This was lower than a distant echo.        He came back to discuss this.  She is actually felt better with less intense palpitations since starting the beta-blocker although it does seem to wear off between doses.  She denies any shortness of breath.  She climbs stairs.  She walks routinely. The patient denies any new symptoms such as chest discomfort, neck or arm discomfort. There has been no new shortness of breath, PND or orthopnea. There has been no presyncope or syncope.   Past Medical History:  Diagnosis Date  . Allergy   . Asthma   . GERD (gastroesophageal reflux disease)    occ  . Hyperlipidemia   . Hypertension   . Neuromuscular disorder (HCC)    sciatica  . Skin cancer   . WPW (Wolff-Parkinson-White syndrome)    s/p ablation in 1992    Past Surgical History:  Procedure Laterality Date  . ABDOMINAL HYSTERECTOMY    . CARDIAC CATHETERIZATION    . CARDIAC ELECTROPHYSIOLOGY STUDY AND ABLATION    . CHOLECYSTECTOMY     . COLONOSCOPY    . exploratory GYN surgery    . skin cancer removal     several  . TUBAL LIGATION       Current Outpatient Medications  Medication Sig Dispense Refill  . albuterol (PROVENTIL HFA;VENTOLIN HFA) 108 (90 Base) MCG/ACT inhaler Inhale 2 puffs into the lungs every 6 (six) hours as needed. 1 Inhaler 0  . cetirizine (ZYRTEC) 10 MG tablet Take 10 mg by mouth daily.    . Cholecalciferol (VITAMIN D3) 50000 units TABS Take 50,000 Units by mouth once a week. 1 tab po weekly for 6 weeks. 6 tablet 0  . cyclobenzaprine (FLEXERIL) 5 MG tablet Take 1 tablet (5 mg total) by mouth 3 (three) times daily as needed for muscle spasms. 30 tablet 1  . ibuprofen (ADVIL,MOTRIN) 200 MG tablet Take 600 mg by mouth every 6 (six) hours as needed.    . simvastatin (ZOCOR) 20 MG tablet Take 1 tablet (20 mg total) by mouth at bedtime. 90 tablet 3  . metoprolol succinate (TOPROL-XL) 100 MG 24 hr tablet Take 1 tablet (100 mg total) by mouth daily. Take with or immediately following a meal. 30 tablet 0   Current Facility-Administered Medications  Medication Dose Route Frequency Provider Last Rate Last Dose  . 0.9 %  sodium  chloride infusion  500 mL Intravenous Once Milus Banister, MD        Allergies:   Codeine; Other; and Penicillins    ROS:  Please see the history of present illness.   Otherwise, review of systems are positive for none.   All other systems are reviewed and negative.    PHYSICAL EXAM: VS:  BP 134/90 (BP Location: Left Arm, Patient Position: Sitting, Cuff Size: Large)   Pulse 80   Ht 5\' 1"  (1.549 m)   Wt 182 lb (82.6 kg)   BMI 34.39 kg/m  , BMI Body mass index is 34.39 kg/m.  GENERAL:  Well appearing NECK:  No jugular venous distention, waveform within normal limits, carotid upstroke brisk and symmetric, no bruits, no thyromegaly LUNGS:  Clear to auscultation bilaterally CHEST:  Unremarkable HEART:  PMI not displaced or sustained,S1 and S2 within normal limits, no S3, no S4,  no clicks, no rubs, no murmurs ABD:  Flat, positive bowel sounds normal in frequency in pitch, no bruits, no rebound, no guarding, no midline pulsatile mass, no hepatomegaly, no splenomegaly EXT:  2 plus pulses throughout, no edema, no cyanosis no clubbing   EKG:  EKG is not ordered today.    Recent Labs: 03/21/2017: ALT 20; BUN 15; Creatinine, Ser 0.66; Hemoglobin 13.0; Magnesium 2.4; Platelets 292.0; Potassium 5.1; Sodium 142; TSH 3.32    Lipid Panel    Component Value Date/Time   CHOL 270 (H) 01/02/2017 0829   TRIG 240.0 (H) 01/02/2017 0829   HDL 67.60 01/02/2017 0829   CHOLHDL 4 01/02/2017 0829   VLDL 48.0 (H) 01/02/2017 0829   LDLCALC 178 (H) 11/16/2014 0824   LDLDIRECT 165.0 01/02/2017 0829      Wt Readings from Last 3 Encounters:  07/26/17 182 lb (82.6 kg)  07/13/17 184 lb 3.2 oz (83.6 kg)  06/29/17 182 lb (82.6 kg)      Other studies Reviewed: Additional studies/ records that were reviewed today include: Labs. Review of the above records demonstrates:  Please see elsewhere in the note.     ASSESSMENT AND PLAN:  PALPITATIONS:    These are going to be treated in the context of treating the cardiomypathy.    CARDIOMYOPATHY: The etiology of this is not clear.  There is a family history of early coronary disease but she has had no chest pain.  She does not have any symptoms of volume overload.  I am going to begin med titration.  No change her to Toprol-XL 100 mg daily.  I will see her back in 2 weeks and hopefully be able to start [facility].  My plan is to continue med titration and then repeat the echocardiogram.  She will need further work-up to include probably CT coronary angiography and possibly an MRI.  The patient, her husband and I had a long discussion about possible etiologies.  She does have a family history of her father having cardiac myopathy but he clearly had coronary disease as his etiology though it was at a very young age.  She is had no symptoms  consistent with this but we will evaluate for that.  There is no familial dilated cardiomyopathy.  She is had no acute illnesses.  Thyroid has been normal.  She does not drink alcohol.  She has had elevated blood pressure but this has not been poorly treated.  HTN:   This is being managed in the context of treating his CHF   Current medicines are reviewed at length with  the patient today.  The patient does not have concerns regarding medicines.  The following changes have been made:  As above  Labs/ tests ordered today include:    No orders of the defined types were placed in this encounter.    Disposition:   FU me in two weeks.      Signed, Minus Breeding, MD  07/26/2017 12:29 PM    [facility]

## 2017-07-26 ENCOUNTER — Encounter: Payer: Self-pay | Admitting: Cardiology

## 2017-07-26 ENCOUNTER — Ambulatory Visit (INDEPENDENT_AMBULATORY_CARE_PROVIDER_SITE_OTHER): Payer: Commercial Managed Care - PPO | Admitting: Cardiology

## 2017-07-26 VITALS — BP 134/90 | HR 80 | Ht 61.0 in | Wt 182.0 lb

## 2017-07-26 DIAGNOSIS — I420 Dilated cardiomyopathy: Secondary | ICD-10-CM | POA: Diagnosis not present

## 2017-07-26 DIAGNOSIS — R002 Palpitations: Secondary | ICD-10-CM | POA: Diagnosis not present

## 2017-07-26 MED ORDER — METOPROLOL SUCCINATE ER 100 MG PO TB24: 100.0000 mg | ORAL_TABLET | Freq: Every day | ORAL | 0 refills | Status: DC

## 2017-07-26 MED ORDER — METOPROLOL SUCCINATE ER 100 MG PO TB24: 100.0000 mg | ORAL_TABLET | Freq: Every day | ORAL | 3 refills | Status: DC

## 2017-07-26 NOTE — Patient Instructions (Signed)
Medication Instructions:  START- Toprol XL 100 mg daily  If you need a refill on your cardiac medications before your next appointment, please call your pharmacy.  Labwork: None Ordered   Testing/Procedures: None Ordered  Follow-Up: Your physician wants you to follow-up in: 2 Weeks.      Thank you for choosing [facility] at [facility]!!

## 2017-07-30 ENCOUNTER — Encounter: Payer: Self-pay | Admitting: Cardiology

## 2017-07-30 ENCOUNTER — Telehealth: Payer: Self-pay | Admitting: *Deleted

## 2017-07-30 NOTE — Telephone Encounter (Signed)
Left message for pt to call    I have been taking the 100mg  Metoprolol around 7pm in the evenings. For the past 2 days, I have needed an additional 25mg  of the fast acting at lunch time, to get through the afternoon.    I am still experiencing low energy even though the palpitations don't feel as intense. You mentioned that there is a non-envasive way to check for blockages. Could we go ahead and schedule that test? I would prefer to know earlier than later.    Thank you.

## 2017-08-09 NOTE — Progress Notes (Signed)
Cardiology Office Note   Date:  08/10/2017   ID:  Renee Walters, DOB 28-Aug-1960, MRN [id number]  PCP:  Lucille Passy, MD  Cardiologist:   No primary care provider on file.   Chief Complaint  Patient presents with  . Cardiomyopathy      History of Present Illness: Renee Walters is a 56 y.o. female who presents for evaluation of palpitations.  She has a history of palpitations with WPW ablation in 1992. This was done at [facility].    In May she presented for evaluation of palpitations.  She wore a monitor. There are no sustained arrhythmias other than runs of bigeminy. There are brief runs of atrial tach that are not reported with symptoms.  We reviewed the monitor.  10% of the beats were premature beats ventricular and then there were supraventricular beats as well.  I started her on beta blockers and she called and reported that she has had improvement in symptoms.  An echo was ordered and it demonstrated a reduced EF of 25%.  This was lower than a previous echo.   At the last visit increased changed the beta blocker to XL.    She has had a little light headedness and some dyspnea that she might have associated with eating out.  The patient denies any new symptoms such as chest discomfort, neck or arm discomfort. . There have been no reported palpitations or syncope.  She is walking for exercise.    Past Medical History:  Diagnosis Date  . Allergy   . Asthma   . Family history of early CAD 01/24/2012  . GERD (gastroesophageal reflux disease)    occ  . Hyperlipidemia   . Hypertension   . Leg cramping 03/21/2017  . Neuromuscular disorder (HCC)    sciatica  . Palpitation 11/05/2014  . Sciatica 03/21/2017  . Skin cancer   . WPW (Wolff-Parkinson-White syndrome)    s/p ablation in 1992    Past Surgical History:  Procedure Laterality Date  . ABDOMINAL HYSTERECTOMY    . CARDIAC CATHETERIZATION    . CARDIAC ELECTROPHYSIOLOGY STUDY AND ABLATION    . CHOLECYSTECTOMY    .  COLONOSCOPY    . exploratory GYN surgery    . skin cancer removal     several  . TUBAL LIGATION       Current Outpatient Medications  Medication Sig Dispense Refill  . albuterol (PROVENTIL HFA;VENTOLIN HFA) 108 (90 Base) MCG/ACT inhaler Inhale 2 puffs into the lungs every 6 (six) hours as needed. 1 Inhaler 0  . cetirizine (ZYRTEC) 10 MG tablet Take 10 mg by mouth daily.    . Cholecalciferol (VITAMIN D3) 50000 units TABS Take 50,000 Units by mouth once a week. 1 tab po weekly for 6 weeks. 6 tablet 0  . cyclobenzaprine (FLEXERIL) 5 MG tablet Take 1 tablet (5 mg total) by mouth 3 (three) times daily as needed for muscle spasms. 30 tablet 1  . ibuprofen (ADVIL,MOTRIN) 200 MG tablet Take 600 mg by mouth every 6 (six) hours as needed.    . metoprolol succinate (TOPROL-XL) 100 MG 24 hr tablet Take 1 tablet (100 mg total) by mouth daily. Take with or immediately following a meal. 30 tablet 0  . simvastatin (ZOCOR) 20 MG tablet Take 1 tablet (20 mg total) by mouth at bedtime. 90 tablet 3   Current Facility-Administered Medications  Medication Dose Route Frequency Provider Last Rate Last Dose  . 0.9 %  sodium chloride infusion  500 mL Intravenous Once Milus Banister, MD        Allergies:   Codeine; Other; and Penicillins    ROS:  Please see the history of present illness.   Otherwise, review of systems are positive for none.   All other systems are reviewed and negative.    PHYSICAL EXAM: VS:  BP (!) 150/82   Pulse 69   Ht 5\' 1"  (1.549 m)   Wt 182 lb 9.6 oz (82.8 kg)   BMI 34.50 kg/m  , BMI Body mass index is 34.5 kg/m.  GENERAL:  Well appearing NECK:  No jugular venous distention, waveform within normal limits, carotid upstroke brisk and symmetric, no bruits, no thyromegaly LUNGS:  Clear to auscultation bilaterally CHEST:  Unremarkable HEART:  PMI not displaced or sustained,S1 and S2 within normal limits, no S3, no S4, no clicks, no rubs, no murmurs ABD:  Flat, positive bowel  sounds normal in frequency in pitch, no bruits, no rebound, no guarding, no midline pulsatile mass, no hepatomegaly, no splenomegaly EXT:  2 plus pulses throughout, no edema, no cyanosis no clubbing   EKG:  EKG is not ordered today.    Recent Labs: 03/21/2017: ALT 20; BUN 15; Creatinine, Ser 0.66; Hemoglobin 13.0; Magnesium 2.4; Platelets 292.0; Potassium 5.1; Sodium 142; TSH 3.32    Lipid Panel    Component Value Date/Time   CHOL 270 (H) 01/02/2017 0829   TRIG 240.0 (H) 01/02/2017 0829   HDL 67.60 01/02/2017 0829   CHOLHDL 4 01/02/2017 0829   VLDL 48.0 (H) 01/02/2017 0829   LDLCALC 178 (H) 11/16/2014 0824   LDLDIRECT 165.0 01/02/2017 0829      Wt Readings from Last 3 Encounters:  08/10/17 182 lb 9.6 oz (82.8 kg)  07/26/17 182 lb (82.6 kg)  07/13/17 184 lb 3.2 oz (83.6 kg)      Other studies Reviewed: Additional studies/ records that were reviewed today include: None Review of the above records demonstrates:     ASSESSMENT AND PLAN:  PALPITATIONS:    She is taking an extra 25 mg of metoprolol tartrate when she is having palpitations.    CARDIOMYOPATHY:     The etiology of this is not clear.  There is a family history of early coronary disease but she has had no chest pain.  She does not have any symptoms of volume overload.   Today I will start Entresto.  I would like for her to come back in two weeks likely to titrate the Entresto.  She is very anxious about the family history of CAD and so I will schedule a coronary CTA.   My plan is to continue med titration and then repeat the echocardiogram.    HTN:  This is being managed in the context of treating his CHF   Current medicines are reviewed at length with the patient today.  The patient does not have concerns regarding medicines.  The following changes have been made:  As above  Labs/ tests ordered today include:  None  Orders Placed This Encounter  Procedures  . CT CORONARY MORPH W/CTA COR W/SCORE W/CA W/CM  &/OR WO/CM  . CT CORONARY FRACTIONAL FLOW RESERVE DATA PREP  . CT CORONARY FRACTIONAL FLOW RESERVE FLUID ANALYSIS  . Basic Metabolic Panel (BMET)     Disposition:   Follow up with APP for two weeks for med titration.    Signed, Minus Breeding, MD  08/10/2017 8:37 AM    [facility]

## 2017-08-10 ENCOUNTER — Encounter: Payer: Self-pay | Admitting: Cardiology

## 2017-08-10 ENCOUNTER — Ambulatory Visit (INDEPENDENT_AMBULATORY_CARE_PROVIDER_SITE_OTHER): Payer: Commercial Managed Care - PPO | Admitting: Cardiology

## 2017-08-10 VITALS — BP 150/82 | HR 69 | Ht 61.0 in | Wt 182.6 lb

## 2017-08-10 DIAGNOSIS — I10 Essential (primary) hypertension: Secondary | ICD-10-CM

## 2017-08-10 DIAGNOSIS — I429 Cardiomyopathy, unspecified: Secondary | ICD-10-CM | POA: Diagnosis not present

## 2017-08-10 NOTE — Patient Instructions (Addendum)
Medication Instructions:  Continue current medications  If you need a refill on your cardiac medications before your next appointment, please call your pharmacy.  Labwork: BMP HERE IN OUR OFFICE AT [facility]  Take the provided lab slips with you to the lab for your blood draw.   You will NOT need to fast   Testing/Procedures: Non-Cardiac CT Angiography (CTA), is a special type of CT scan that uses a computer to produce multi-dimensional views of major blood vessels throughout the body. In CT angiography, a contrast material is injected through an IV to help visualize the blood vessels  Special Instructions:  Please arrive at the [facility] main entrance of [facility] at              AM (30-45 minutes prior to test start time)  [facility] [place], [place] [address] [phone]  Proceed to the [facility] Radiology Department (First Floor).  Please follow these instructions carefully (unless otherwise directed):  Hold all erectile dysfunction medications at least 48 hours prior to test.  On the Night Before the Test: . Drink plenty of water. . Do not consume any caffeinated/decaffeinated beverages or chocolate 12 hours prior to your test. . Do not take any antihistamines 12 hours prior to your test. . If you take Metformin do not take 24 hours prior to test. . If the patient has contrast allergy: ? Patient will need a prescription for Prednisone and very clear instructions (as follows): 1. Prednisone 50 mg - take 13 hours prior to test 2. Take another Prednisone 50 mg 7 hours prior to test 3. Take another Prednisone 50 mg 1 hour prior to test 4. Take Benadryl 50 mg 1 hour prior to test . Patient must complete all four doses of above prophylactic medications. . Patient will need a ride after test due to Benadryl.  On the Day of the Test: . Drink plenty of water. Do not drink any water within one hour of the test. . Do not eat any food 4  hours prior to the test. . You may take your regular medications prior to the test. . IF NOT ON A BETA BLOCKER - Take 50 mg of lopressor (metoprolol) one hour before the test. . HOLD Furosemide morning of the test.  After the Test: . Drink plenty of water. . After receiving IV contrast, you may experience a mild flushed feeling. This is normal. . On occasion, you may experience a mild rash up to 24 hours after the test. This is not dangerous. If this occurs, you can take Benadryl 25 mg and increase your fluid intake. . If you experience trouble breathing, this can be serious. If it is severe call 911 IMMEDIATELY. If it is mild, please call our office. . If you take any of these medications: Glipizide/Metformin, Avandament, Glucavance, please do not take 48 hours after completing test.   Follow-Up: Your physician wants you to follow-up in: 2 Weeks with Rosaria Ferries.       Thank you for choosing [facility] at [facility]!!

## 2017-08-10 NOTE — Telephone Encounter (Signed)
Patient was seen today.

## 2017-08-13 ENCOUNTER — Telehealth: Payer: Self-pay | Admitting: Cardiology

## 2017-08-13 NOTE — Telephone Encounter (Signed)
New message   Pt c/o medication issue:  1. Name of Medication:  Metoprolol  2. How are you currently taking this medication (dosage and times per day)?  3. Are you having a reaction (difficulty breathing--STAT)? No  4. What is your medication issue? Patient wants to know if she should continue extra dose of Metoprolol now that she is taking [name]

## 2017-08-13 NOTE — Telephone Encounter (Signed)
She shouldn't need to take extra dose of metoprolol unless she is having palpitations/rapid heart rate.  Her metoprolol succ dose is already higher than when she was on the tartrate

## 2017-08-13 NOTE — Telephone Encounter (Signed)
1) Spoke with patient and she was given Entresto samples at last office visit. She was wanting to know if ok to continue her Metoprolol tart she uses as needed. She had been taking mid day in addition to her Toprol 100 mg daily. Since starting the [facility] she has not been taking the Tartrate but feels like she could use it. Will forward to Pharm D for review

## 2017-08-13 NOTE — Telephone Encounter (Signed)
Advised patient, verbalized understanding  

## 2017-08-20 ENCOUNTER — Telehealth: Payer: Self-pay | Admitting: Cardiology

## 2017-08-20 MED ORDER — SACUBITRIL-VALSARTAN 24-26 MG PO TABS: 1.0000 | ORAL_TABLET | Freq: Two times a day (BID) | ORAL | 0 refills | Status: DC

## 2017-08-20 NOTE — Telephone Encounter (Signed)
New Message:       Pt is stating that she needing some samples of Entresto or needing a prescription sent to [facility] for this medication.

## 2017-08-20 NOTE — Telephone Encounter (Signed)
Left detailed message on patients cell phone Rx sent to [facility], ok per DPR. Number listed on phone message call would not go through.

## 2017-08-21 ENCOUNTER — Encounter: Payer: Self-pay | Admitting: Family Medicine

## 2017-08-21 ENCOUNTER — Encounter: Payer: Self-pay | Admitting: Adult Health

## 2017-08-21 ENCOUNTER — Telehealth: Payer: Self-pay | Admitting: Cardiology

## 2017-08-21 NOTE — Telephone Encounter (Signed)
Pt calling   Pt is stating her medication will cost her $500 for Entresto. Pt stated she can't pay that and need to speak to nurse for another alternative. Please call pt.

## 2017-08-21 NOTE — Telephone Encounter (Signed)
Returned call to patient who states Renee Walters will cost her $500/month. She was given samples when she first started and a "card" in her sample bag. She is unsure if this is a copay card. Advised I will send her the patient assistance/copay info via [employer]. She will activate card and take to pharmacy

## 2017-08-22 ENCOUNTER — Other Ambulatory Visit: Payer: Self-pay | Admitting: Cardiology

## 2017-08-27 ENCOUNTER — Ambulatory Visit: Payer: Self-pay | Admitting: Cardiology

## 2017-08-28 ENCOUNTER — Other Ambulatory Visit: Payer: Self-pay

## 2017-08-28 DIAGNOSIS — I429 Cardiomyopathy, unspecified: Secondary | ICD-10-CM | POA: Diagnosis not present

## 2017-08-28 LAB — BASIC METABOLIC PANEL
BUN / CREAT RATIO: 18 (ref 9–23)
BUN: 14 mg/dL (ref 6–24)
CO2: 22 mmol/L (ref 20–29)
CREATININE: 0.78 mg/dL (ref 0.57–1.00)
Calcium: 9.4 mg/dL (ref 8.7–10.2)
Chloride: 100 mmol/L (ref 96–106)
GFR calc Af Amer: 98 mL/min/{1.73_m2} (ref >59)
GFR calc non Af Amer: 85 mL/min/{1.73_m2} (ref >59)
Glucose: 102 mg/dL — ABNORMAL HIGH (ref 65–99)
POTASSIUM: 5.1 mmol/L (ref 3.5–5.2)
SODIUM: 139 mmol/L (ref 134–144)

## 2017-08-28 MED ORDER — METOPROLOL SUCCINATE ER 100 MG PO TB24: 100.0000 mg | ORAL_TABLET | Freq: Every day | ORAL | 3 refills | Status: DC

## 2017-08-29 ENCOUNTER — Ambulatory Visit (INDEPENDENT_AMBULATORY_CARE_PROVIDER_SITE_OTHER): Payer: Commercial Managed Care - PPO | Admitting: Adult Health

## 2017-08-29 ENCOUNTER — Encounter: Payer: Self-pay | Admitting: Adult Health

## 2017-08-29 VITALS — BP 138/73 | HR 63 | Ht 61.0 in | Wt 180.8 lb

## 2017-08-29 DIAGNOSIS — I456 Pre-excitation syndrome: Secondary | ICD-10-CM

## 2017-08-29 DIAGNOSIS — I43 Cardiomyopathy in diseases classified elsewhere: Secondary | ICD-10-CM

## 2017-08-29 DIAGNOSIS — I5022 Chronic systolic (congestive) heart failure: Secondary | ICD-10-CM

## 2017-08-29 DIAGNOSIS — I10 Essential (primary) hypertension: Secondary | ICD-10-CM | POA: Diagnosis not present

## 2017-08-29 NOTE — Progress Notes (Signed)
Cardiology Office Note   Date:  08/29/2017   ID:  Renee Walters, DOB [birth year], MRN [id number]  PCP:  Lucille Passy, MD  Cardiologist: [facility]  Chief Complaint  Patient presents with  . Follow-up  . Congestive Heart Failure  . Cardiomyopathy     History of Present Illness: Renee Walters is a 57 y.o. female who presents for ongoing assessment and management of palpitations, with history of WPW and ablation at [facility]. Renee Walters was stated on BB in the setting of SVT. An echo was completed which demonstrated EF of 25%.  Renee Walters was increased on BB dose to metoprolol XL. Other history includes hypercholesterolemia,  Asthma and GERD. Renee Walters was placed on Entresto. Renee Walters was schedule for Cardiac CTA.   Echocardiogram 07/18/2017 Left ventricle: The cavity size was moderately dilated. Wall   thickness was increased in a pattern of mild LVH. Systolic   function was severely reduced. The estimated ejection fraction   was in the range of 20% to 25%. Diffuse hypokinesis. The study is   not technically sufficient to allow evaluation of LV diastolic   function. - Mitral valve: There was mild regurgitation. - Atrial septum: No defect or patent foramen ovale was identified. - Pericardium, extracardiac: Small pericardial effusion no   tamponade.  Holter Monitor  Notes recorded by Minus Breeding, MD on 07/09/2017 at 8:55 AM EDT There are no sustained arrhythmias other than runs of bigeminy. There are brief runs of atrial tach that are not reported with symptoms. No atrial fib  Renee Walters is here today without complaints. Renee Walters is worried about her BP being too low after taking Entresto but finds it normalizes into the 130's later in the day. Renee Walters denies dizziness or dyspnea.   Past Medical History:  Diagnosis Date  . Allergy   . Asthma   . Family history of early CAD 01/24/2012  . GERD (gastroesophageal reflux disease)    occ  . Hyperlipidemia   . Hypertension   . Leg cramping 03/21/2017  . Neuromuscular disorder  (HCC)    sciatica  . Palpitation 11/05/2014  . Sciatica 03/21/2017  . Skin cancer   . WPW (Wolff-Parkinson-White syndrome)    s/p ablation in 1992    Past Surgical History:  Procedure Laterality Date  . ABDOMINAL HYSTERECTOMY    . CARDIAC CATHETERIZATION    . CARDIAC ELECTROPHYSIOLOGY STUDY AND ABLATION    . CHOLECYSTECTOMY    . COLONOSCOPY    . exploratory GYN surgery    . skin cancer removal     several  . TUBAL LIGATION       Current Outpatient Medications  Medication Sig Dispense Refill  . albuterol (PROVENTIL HFA;VENTOLIN HFA) 108 (90 Base) MCG/ACT inhaler Inhale 2 puffs into the lungs every 6 (six) hours as needed. 1 Inhaler 0  . cetirizine (ZYRTEC) 10 MG tablet Take 10 mg by mouth daily.    . Cholecalciferol (VITAMIN D3) 50000 units TABS Take 50,000 Units by mouth once a week. 1 tab po weekly for 6 weeks. 6 tablet 0  . cyclobenzaprine (FLEXERIL) 5 MG tablet Take 1 tablet (5 mg total) by mouth 3 (three) times daily as needed for muscle spasms. 30 tablet 1  . ibuprofen (ADVIL,MOTRIN) 200 MG tablet Take 600 mg by mouth every 6 (six) hours as needed.    . metoprolol succinate (TOPROL-XL) 100 MG 24 hr tablet Take 1 tablet (100 mg total) by mouth daily. Take with or immediately following a meal. 90 tablet 3  .  sacubitril-valsartan (ENTRESTO) 24-26 MG Take 1 tablet by mouth 2 (two) times daily. 60 tablet 0  . simvastatin (ZOCOR) 20 MG tablet Take 1 tablet (20 mg total) by mouth at bedtime. 90 tablet 3   Current Facility-Administered Medications  Medication Dose Route Frequency Provider Last Rate Last Dose  . 0.9 %  sodium chloride infusion  500 mL Intravenous Once Milus Banister, MD        Allergies:   Codeine; Other; and Penicillins    Social History:  The patient  reports that Renee Walters has never smoked. Renee Walters has never used smokeless tobacco. Renee Walters reports that Renee Walters drinks alcohol. Renee Walters reports that Renee Walters does not use drugs.   Family History:  The patient's family history includes  Arthritis in her mother; COPD in her mother; Cancer in her sister; Colon polyps in her paternal grandmother; Diabetes in her father, mother, and sister; Hearing loss in her mother; Heart attack in her father; Heart disease in her paternal grandfather; Heart disease (age of onset: 64) in her father; Hyperlipidemia in her brother, brother, father, and sister; Hypertension in her brother, brother, father, mother, and sister; Lung cancer in her maternal grandfather; Miscarriages / [place] in her mother; Yves Dill Parkinson White syndrome in her sister and unknown relative.    ROS: All other systems are reviewed and negative. Unless otherwise mentioned in H&P    PHYSICAL EXAM: VS:  BP 138/73   Pulse 63   Ht 5\' 1"  (1.549 m)   Wt 180 lb 12.8 oz (82 kg)   BMI 34.16 kg/m  , BMI Body mass index is 34.16 kg/m. GEN: Well nourished, well developed, in no acute distress  HEENT: normal  Neck: no JVD, carotid bruits, or masses Cardiac: RRR; no murmurs, rubs, or gallops,no edema  Respiratory:  clear to auscultation bilaterally, normal work of breathing GI: soft, nontender, nondistended, + BS MS: no deformity or atrophy  Skin: warm and dry, no rash Neuro:  Strength and sensation are intact Psych: euthymic mood, full affect   EKG:  Not completed during this office visit.   Recent Labs: 03/21/2017: ALT 20; Hemoglobin 13.0; Magnesium 2.4; Platelets 292.0; TSH 3.32 08/28/2017: BUN 14; Creatinine, Ser 0.78; Potassium 5.1; Sodium 139    Lipid Panel    Component Value Date/Time   CHOL 270 (H) 01/02/2017 0829   TRIG 240.0 (H) 01/02/2017 0829   HDL 67.60 01/02/2017 0829   CHOLHDL 4 01/02/2017 0829   VLDL 48.0 (H) 01/02/2017 0829   LDLCALC 178 (H) 11/16/2014 0824   LDLDIRECT 165.0 01/02/2017 0829      Wt Readings from Last 3 Encounters:  08/29/17 180 lb 12.8 oz (82 kg)  08/10/17 182 lb 9.6 oz (82.8 kg)  07/26/17 182 lb (82.6 kg)    Other studies Reviewed: Echocardiogram 08/06/2017 Left ventricle:  The cavity size was moderately dilated. Wall   thickness was increased in a pattern of mild LVH. Systolic   function was severely reduced. The estimated ejection fraction   was in the range of 20% to 25%. Diffuse hypokinesis. The study is   not technically sufficient to allow evaluation of LV diastolic   function. - Mitral valve: There was mild regurgitation. - Atrial septum: No defect or patent foramen ovale was identified. - Pericardium, extracardiac: Small pericardial effusion no   tamponade.  ASSESSMENT AND PLAN:  1. Systolic Dysfunction: Renee Walters has multiple questions about her heart function and blood pressure. I have given explanation about this and discussed the need to have lower than '  normal" blood pressure in reduced EF. Renee Walters now verbalizes understanding. I will try and titrate up the Entresto to 49/51 mg BID. Renee Walters will try it on a weekend day to evaluate how Renee Walters responds to higher dose, and record her BP. I have given her parameters for BP at 100/50 to 110/55 as a good range for her. If Renee Walters cannot tolerate higher dose Renee Walters will return to lower dose of 24/26 mg BID.   2. R/O ischemic heart disease: Renee Walters is to have Cardiac CT. Renee Walters is to continue BB as directed. Follow up with Dr. Percival Spanish on previously scheduled appointment.   3. Hx of WPW: S/P Ablation at [facility]. Continue current BB therapy.    Current medicines are reviewed at length with the patient today.    Labs/ tests ordered today include: None Phill Myron. West Pugh, ANP, AACC   08/29/2017 1:28 PM    [facility] [address], [place], [place] [address] Phone: [phone]; Fax: [phone]

## 2017-08-29 NOTE — Patient Instructions (Signed)
Medication Instructions:  START ENTRESTO 49/51MG  ON THE WEEKEND-CALL WITH ANY QUESTIONS  If you need a refill on your cardiac medications before your next appointment, please call your pharmacy.  Special Instructions: MAKE SURE YOU KEEP CT APPOINTMENT  MAKE SURE TO TAKE BP DAILY HOLD 100-110/50-55 HOLD IF BO IS LOWER  Follow-Up: Your physician wants you to follow-up in: [place].  Thank you for choosing [facility] at [facility]!!

## 2017-09-04 ENCOUNTER — Ambulatory Visit (HOSPITAL_COMMUNITY)
Admission: RE | Admit: 2017-09-04 | Discharge: 2017-09-04 | Disposition: A | Discharge status: Home / Self Care | Payer: Commercial Managed Care - PPO | Source: Ambulatory Visit | Attending: Cardiology | Admitting: Cardiology

## 2017-09-04 DIAGNOSIS — I429 Cardiomyopathy, unspecified: Secondary | ICD-10-CM

## 2017-09-04 DIAGNOSIS — I313 Pericardial effusion (noninflammatory): Secondary | ICD-10-CM | POA: Diagnosis not present

## 2017-09-04 DIAGNOSIS — R079 Chest pain, unspecified: Secondary | ICD-10-CM

## 2017-09-04 MED ORDER — IOPAMIDOL (ISOVUE-370) INJECTION 76%
100.0000 mL | Freq: Once | INTRAVENOUS | Status: AC | PRN
  Administered 2017-09-04: 80 mL via INTRAVENOUS

## 2017-09-04 MED ORDER — NITROGLYCERIN 0.4 MG SL SUBL
SUBLINGUAL_TABLET | SUBLINGUAL | Status: AC
  Administered 2017-09-04: 0.8 mg
  Filled 2017-09-04: qty 1

## 2017-09-05 ENCOUNTER — Encounter: Payer: Self-pay | Admitting: Adult Health

## 2017-09-05 NOTE — Progress Notes (Signed)
Cardiology Office Note   Date:  09/07/2017   ID:  MADGELINE Walters, DOB [birth year], MRN [id number]  PCP:  Lucille Passy, MD  Cardiologist:   No primary care provider on file.   Chief Complaint  Patient presents with  . Congestive Heart Failure      History of Present Illness: Renee Walters is a 57 y.o. female who presents for evaluation of palpitations.  She has a history of palpitations with WPW ablation in 1992. This was done at [facility].    In May she presented for evaluation of palpitations.  She wore a monitor. There are no sustained arrhythmias other than runs of bigeminy. There are brief runs of atrial tach that are not reported with symptoms.  We reviewed the monitor.  10% of the beats were premature beats ventricular and then there were supraventricular beats as well.  I started her on beta blockers and she called and reported that she has had improvement in symptoms.  An echo was ordered and it demonstrated a reduced EF of 25%.  This was lower than a previous echo.  At the last visit her [name] was titrated.  However, she did not tolerate higher dose.  She has had some low blood pressures in the 80s.  She tolerates the morning increased dose.  She is been taking her Toprol.  She is exercising routinely.  She is not having any significant shortness of breath.  She denies any PND or orthopnea.  Of note she had a CTA.  There is a small pericardial effusion previously noted on echo.  She had some atelectasis.  She had mild coronary plaque with an LAD 30% stenosis.   Past Medical History:  Diagnosis Date  . Allergy   . Asthma   . Family history of early CAD 01/24/2012  . GERD (gastroesophageal reflux disease)    occ  . Hyperlipidemia   . Hypertension   . Leg cramping 03/21/2017  . Neuromuscular disorder (HCC)    sciatica  . Palpitation 11/05/2014  . Sciatica 03/21/2017  . Skin cancer   . WPW (Wolff-Parkinson-White syndrome)    s/p ablation in 1992    Past Surgical  History:  Procedure Laterality Date  . ABDOMINAL HYSTERECTOMY    . CARDIAC CATHETERIZATION    . CARDIAC ELECTROPHYSIOLOGY STUDY AND ABLATION    . CHOLECYSTECTOMY    . COLONOSCOPY    . exploratory GYN surgery    . skin cancer removal     several  . TUBAL LIGATION       Current Outpatient Medications  Medication Sig Dispense Refill  . albuterol (PROVENTIL HFA;VENTOLIN HFA) 108 (90 Base) MCG/ACT inhaler Inhale 2 puffs into the lungs every 6 (six) hours as needed. 1 Inhaler 0  . cetirizine (ZYRTEC) 10 MG tablet Take 10 mg by mouth daily.    . Cholecalciferol (VITAMIN D3) 50000 units TABS Take 50,000 Units by mouth once a week. 1 tab po weekly for 6 weeks. 6 tablet 0  . cyclobenzaprine (FLEXERIL) 5 MG tablet Take 1 tablet (5 mg total) by mouth 3 (three) times daily as needed for muscle spasms. 30 tablet 1  . ibuprofen (ADVIL,MOTRIN) 200 MG tablet Take 600 mg by mouth every 6 (six) hours as needed.    . metoprolol succinate (TOPROL-XL) 100 MG 24 hr tablet Take 1 tablet (100 mg total) by mouth daily. Take with or immediately following a meal. 90 tablet 3  . simvastatin (ZOCOR) 20 MG tablet Take 1  tablet (20 mg total) by mouth at bedtime. 90 tablet 3  . sacubitril-valsartan (ENTRESTO) 49-51 MG Take 1 tablet by mouth 2 (two) times daily. 60 tablet 11   Current Facility-Administered Medications  Medication Dose Route Frequency Provider Last Rate Last Dose  . 0.9 %  sodium chloride infusion  500 mL Intravenous Once Milus Banister, MD        Allergies:   Codeine; Other; and Penicillins    ROS:  Please see the history of present illness.   Otherwise, review of systems are positive for none.   All other systems are reviewed and negative.    PHYSICAL EXAM: VS:  BP (!) 144/78   Pulse 60   Ht 5\' 1"  (1.549 m)   Wt 182 lb 9.6 oz (82.8 kg)   BMI 34.50 kg/m  , BMI Body mass index is 34.5 kg/m.  GENERAL:  Well appearing NECK:  No jugular venous distention, waveform within normal limits,  carotid upstroke brisk and symmetric, no bruits, no thyromegaly LUNGS:  Clear to auscultation bilaterally CHEST:  Unremarkable HEART:  PMI not displaced or sustained,S1 and S2 within normal limits, no S3, no S4, no clicks, no rubs, no murmurs ABD:  Flat, positive bowel sounds normal in frequency in pitch, no bruits, no rebound, no guarding, no midline pulsatile mass, no hepatomegaly, no splenomegaly EXT:  2 plus pulses throughout, no edema, no cyanosis no clubbing   EKG:  EKG is not ordered today.    Recent Labs: 03/21/2017: ALT 20; Hemoglobin 13.0; Magnesium 2.4; Platelets 292.0; TSH 3.32 08/28/2017: BUN 14; Creatinine, Ser 0.78; Potassium 5.1; Sodium 139    Lipid Panel    Component Value Date/Time   CHOL 270 (H) 01/02/2017 0829   TRIG 240.0 (H) 01/02/2017 0829   HDL 67.60 01/02/2017 0829   CHOLHDL 4 01/02/2017 0829   VLDL 48.0 (H) 01/02/2017 0829   LDLCALC 178 (H) 11/16/2014 0824   LDLDIRECT 165.0 01/02/2017 0829      Wt Readings from Last 3 Encounters:  09/07/17 182 lb 9.6 oz (82.8 kg)  08/29/17 180 lb 12.8 oz (82 kg)  08/10/17 182 lb 9.6 oz (82.8 kg)      Other studies Reviewed: Additional studies/ records that were reviewed today include: BP records Review of the above records demonstrates:  See elsewhere   ASSESSMENT AND PLAN:  PALPITATIONS:    These are better with the metoprolol that she is taking.  No change in therapy is indicated.  CARDIOMYOPATHY:     Going to continue with the higher dose of Entresto in the morning and the lower dose in the afternoon.  Is given instructions that if she tolerates the higher dose twice a day we can do that.  In 2 months she is going to get an echocardiogram.  Further work-up will be based on her ejection fraction.  Likely she will need an MRI.  HTN:  This is being managed in the context of treating his CHF   Current medicines are reviewed at length with the patient today.  The patient does not have concerns regarding  medicines.  The following changes have been made:  As above  Labs/ tests ordered today include:    Orders Placed This Encounter  Procedures  . ECHOCARDIOGRAM COMPLETE     Disposition:   Follow up with me in two months after the echo.   Signed, Minus Breeding, MD  09/07/2017 9:21 AM    [place]

## 2017-09-07 ENCOUNTER — Ambulatory Visit (INDEPENDENT_AMBULATORY_CARE_PROVIDER_SITE_OTHER): Payer: Commercial Managed Care - PPO | Admitting: Cardiology

## 2017-09-07 ENCOUNTER — Encounter: Payer: Self-pay | Admitting: Cardiology

## 2017-09-07 VITALS — BP 144/78 | HR 60 | Ht 61.0 in | Wt 182.6 lb

## 2017-09-07 DIAGNOSIS — I429 Cardiomyopathy, unspecified: Secondary | ICD-10-CM | POA: Diagnosis not present

## 2017-09-07 DIAGNOSIS — R002 Palpitations: Secondary | ICD-10-CM

## 2017-09-07 MED ORDER — SACUBITRIL-VALSARTAN 49-51 MG PO TABS: 1.0000 | ORAL_TABLET | Freq: Two times a day (BID) | ORAL | 11 refills | Status: DC

## 2017-09-07 NOTE — Patient Instructions (Signed)
Medication Instructions:  Continue Current medication  If you need a refill on your cardiac medications before your next appointment, please call your pharmacy.  Labwork: None Ordered  Testing/Procedures: Your physician has requested that you have an echocardiogram. Echocardiography is a painless test that uses sound waves to create images of your heart. It provides your doctor with information about the size and shape of your heart and how well your heart's chambers and valves are working. This procedure takes approximately one hour. There are no restrictions for this procedure.   Follow-Up: Your physician wants you to follow-up in: After Echo.       Thank you for choosing [facility] at [facility]!!

## 2017-09-14 ENCOUNTER — Encounter: Payer: Self-pay | Admitting: Cardiology

## 2017-09-18 ENCOUNTER — Telehealth: Payer: Self-pay | Admitting: Cardiology

## 2017-09-18 MED ORDER — METOPROLOL TARTRATE 25 MG PO TABS: 25.0000 mg | ORAL_TABLET | Freq: Every day | ORAL | 3 refills | Status: DC

## 2017-09-18 NOTE — Telephone Encounter (Signed)
Follow Up:   Pt says she is still waiting to find out about what to do about her Metoprolol.Pt says her blood pressure is now running around 135,which is higher than what they want it to be.

## 2017-09-18 NOTE — Telephone Encounter (Signed)
Spoke with the patient and she is calling for her Metoprolol 25mg  to be sent in and to let Jory Sims know that her BP is staying at 135/70. She says that she is feeling well and about the same and just wanted to let her know about the BP. Advised her that I have sent in her RX and will let Inez Catalina know about the BP and will call her back if she has any further changes to make. Pt agreed.

## 2017-09-18 NOTE — Telephone Encounter (Signed)
Thank you. BP looks great. Ok to send refills.

## 2017-09-27 ENCOUNTER — Telehealth: Payer: Self-pay | Admitting: Cardiology

## 2017-09-27 NOTE — Telephone Encounter (Signed)
Metoprolol 25 mg approved via phone call number [phone] with 3 refills ./cy

## 2017-09-27 NOTE — Telephone Encounter (Signed)
New Message        Pt c/o medication issue:  1. Name of Medication: metoprolol tartrate (LOPRESSOR) 25 MG tablet    2. How are you currently taking this medication (dosage and times per day)?   3. Are you having a reaction (difficulty breathing--STAT)?   4. What is your medication issue? Patient is calling because [facility] needs to confirm that she should be taking both the metoprolol tartrate (LOPRESSOR) 25 MG tablet and metoprolol succinate (TOPROL-XL) 100 MG 24 hr tablet They will not fill the rx until its confirmed. Please call [facility] at [phone].

## 2017-10-02 DIAGNOSIS — I429 Cardiomyopathy, unspecified: Secondary | ICD-10-CM

## 2017-10-04 NOTE — Telephone Encounter (Signed)
Referral placed for [facility]

## 2017-10-05 ENCOUNTER — Telehealth: Payer: Self-pay | Admitting: Cardiology

## 2017-10-05 NOTE — Telephone Encounter (Signed)
Faxed records to get an appointment with Dr. Melvyn Novas.

## 2017-10-09 MED ORDER — SACUBITRIL-VALSARTAN 49-51 MG PO TABS: 1.0000 | ORAL_TABLET | Freq: Two times a day (BID) | ORAL | 11 refills | Status: AC

## 2017-10-09 NOTE — Telephone Encounter (Signed)
Spoke with pt letting her know to increase Entresto 49/51 twice a day. Rx was send into pt pharmacy.

## 2017-10-24 NOTE — Progress Notes (Signed)
Cardiology Office Note   Date:  10/25/2017   ID:  Renee Walters, DOB Nov 15, 1960, MRN [id number]  PCP:  Lucille Passy, MD  Cardiologist:   No primary care provider on file.   No chief complaint on file.     History of Present Illness: Renee Walters is a 57 y.o. female who presents for evaluation of palpitations.  She has a history of palpitations with WPW ablation in 1992. This was done at [facility].    In May she presented for evaluation of palpitations.  She wore a monitor. There are no sustained arrhythmias other than runs of bigeminy. There are brief runs of atrial tach that are not reported with symptoms.  We reviewed the monitor.  10% of the beats were premature beats ventricular and then there were supraventricular beats as well.  I started her on beta blockers and she called and reported that she has had improvement in symptoms.  An echo was ordered and it demonstrated a reduced EF of 25%.  This was lower than a previous echo.  I have been titrating her meds with plans for follow up echo and probable MRI.    She returns and she is just not feeling well.  Her weight is up.  She feels that she has some swelling.  She goes to work she feels very fatigued.  She does have some increased dyspnea.  She is not having any presyncope or syncope.  She is had no chest pressure, neck or arm discomfort.   Past Medical History:  Diagnosis Date  . Allergy   . Asthma   . Family history of early CAD 01/24/2012  . GERD (gastroesophageal reflux disease)    occ  . Hyperlipidemia   . Hypertension   . Leg cramping 03/21/2017  . Neuromuscular disorder (HCC)    sciatica  . Palpitation 11/05/2014  . Sciatica 03/21/2017  . Skin cancer   . WPW (Wolff-Parkinson-White syndrome)    s/p ablation in 1992    Past Surgical History:  Procedure Laterality Date  . ABDOMINAL HYSTERECTOMY    . CARDIAC CATHETERIZATION    . CARDIAC ELECTROPHYSIOLOGY STUDY AND ABLATION    . CHOLECYSTECTOMY    . COLONOSCOPY     . exploratory GYN surgery    . skin cancer removal     several  . TUBAL LIGATION       Current Outpatient Medications  Medication Sig Dispense Refill  . albuterol (PROVENTIL HFA;VENTOLIN HFA) 108 (90 Base) MCG/ACT inhaler Inhale 2 puffs into the lungs every 6 (six) hours as needed. 1 Inhaler 0  . cetirizine (ZYRTEC) 10 MG tablet Take 10 mg by mouth daily.    . Cholecalciferol (VITAMIN D3) 50000 units TABS Take 50,000 Units by mouth once a week. 1 tab po weekly for 6 weeks. 6 tablet 0  . cyclobenzaprine (FLEXERIL) 5 MG tablet Take 1 tablet (5 mg total) by mouth 3 (three) times daily as needed for muscle spasms. 30 tablet 1  . ibuprofen (ADVIL,MOTRIN) 200 MG tablet Take 600 mg by mouth every 6 (six) hours as needed.    . metoprolol succinate (TOPROL-XL) 100 MG 24 hr tablet Take 1 tablet (100 mg total) by mouth daily. Take with or immediately following a meal. 90 tablet 3  . metoprolol tartrate (LOPRESSOR) 25 MG tablet Take 1 tablet (25 mg total) by mouth daily at 3 pm. 180 tablet 3  . sacubitril-valsartan (ENTRESTO) 49-51 MG Take 1 tablet by mouth 2 (two) times  daily. 60 tablet 11  . simvastatin (ZOCOR) 20 MG tablet Take 1 tablet (20 mg total) by mouth at bedtime. 90 tablet 3   Current Facility-Administered Medications  Medication Dose Route Frequency Provider Last Rate Last Dose  . 0.9 %  sodium chloride infusion  500 mL Intravenous Once Milus Banister, MD        Allergies:   Codeine; Other; and Penicillins    ROS:  Please see the history of present illness.   Otherwise, review of systems are positive for none.   All other systems are reviewed and negative.    PHYSICAL EXAM: VS:  BP 138/70 (BP Location: Left Arm, Patient Position: Sitting, Cuff Size: Normal)   Pulse 66   Ht 5\' 1"  (1.549 m)   Wt 190 lb (86.2 kg)   BMI 35.90 kg/m  , BMI Body mass index is 35.9 kg/m.  GENERAL:  Well appearing NECK:  No jugular venous distention, waveform within normal limits, carotid upstroke  brisk and symmetric, no bruits, no thyromegaly LUNGS:  Clear to auscultation bilaterally CHEST:  Unremarkable HEART:  PMI not displaced or sustained,S1 and S2 within normal limits, no S3, no S4, no clicks, no rubs, no murmurs ABD:  Flat, positive bowel sounds normal in frequency in pitch, no bruits, no rebound, no guarding, no midline pulsatile mass, no hepatomegaly, no splenomegaly EXT:  2 plus pulses throughout, mild diffuse edema, no cyanosis no clubbing    EKG:  EKG is not ordered today.    Recent Labs: 03/21/2017: ALT 20; Hemoglobin 13.0; Magnesium 2.4; Platelets 292.0; TSH 3.32 08/28/2017: BUN 14; Creatinine, Ser 0.78; Potassium 5.1; Sodium 139    Lipid Panel    Component Value Date/Time   CHOL 270 (H) 01/02/2017 0829   TRIG 240.0 (H) 01/02/2017 0829   HDL 67.60 01/02/2017 0829   CHOLHDL 4 01/02/2017 0829   VLDL 48.0 (H) 01/02/2017 0829   LDLCALC 178 (H) 11/16/2014 0824   LDLDIRECT 165.0 01/02/2017 0829      Wt Readings from Last 3 Encounters:  10/25/17 190 lb (86.2 kg)  09/07/17 182 lb 9.6 oz (82.8 kg)  08/29/17 180 lb 12.8 oz (82 kg)      Other studies Reviewed: Additional studies/ records that were reviewed today include: None Review of the above records demonstrates:     ASSESSMENT AND PLAN:  PALPITATIONS:    He is a bit better on beta-blockers.  At this point no change in therapy.  Work-up as below.  CARDIOMYOPATHY: We have managed to slowly titrate her Entresto.  Previously she was somewhat sensitive to med titration.  She had multiple questions about being on lisinopril instead as she had done some reading and thought this would be better and we discussed the benefits of Entresto.  She is on a reasonable dose of steroid.  We have helped to facilitate a heart failure clinic appointment at [facility].  I discussed the fact they would want to repeat the echocardiogram.  I suspect work up will also includean  MRI and amyloid work-up.  These would be the next steps.     She wants to start with a repeat echo here which is scheduled but I suspect from her clinical presentation today that her ejection fraction will notbe improved and the above work-up is indicated.  I will start spironolactone 25 mg daily.  She will remain on the other medications.  She is going to get Lasix 20 mg as needed she will take a couple of doses of  this this weekend. I will follow guidelines for spironolactone follow up in heart failure.  (Check potassium levels and  renal function 3--4 days and 1 week, then at least monthly for first 3 months and every 3 months thereafter after initiation of spironolactone.)  I also offered the patient follow-up in our heart failure clinic when she has had her consultation at [facility] this might make treatment and further testing easier she still working.  I will be happy to facilitate this based on the results of the [facility] evaluation.  HTN:  This is being managed in the context of treating his CHF   Current medicines are reviewed at length with the patient today.  The patient does not have concerns regarding medicines.  The following changes have been made:  As above  Labs/ tests ordered today include:   As above  No orders of the defined types were placed in this encounter.    Disposition:    Follow up with me and [facility]   Signed, Minus Breeding, MD  10/25/2017 3:14 PM    [facility]

## 2017-10-25 ENCOUNTER — Encounter: Payer: Self-pay | Admitting: Cardiology

## 2017-10-25 ENCOUNTER — Ambulatory Visit (INDEPENDENT_AMBULATORY_CARE_PROVIDER_SITE_OTHER): Payer: Commercial Managed Care - PPO | Admitting: Cardiology

## 2017-10-25 VITALS — BP 138/70 | HR 66 | Ht 61.0 in | Wt 190.0 lb

## 2017-10-25 DIAGNOSIS — I5022 Chronic systolic (congestive) heart failure: Secondary | ICD-10-CM

## 2017-10-25 DIAGNOSIS — I5021 Acute systolic (congestive) heart failure: Secondary | ICD-10-CM

## 2017-10-25 MED ORDER — FUROSEMIDE 20 MG PO TABS: 20.0000 mg | ORAL_TABLET | Freq: Every day | ORAL | 5 refills | Status: DC

## 2017-10-25 MED ORDER — SPIRONOLACTONE 25 MG PO TABS: 25.0000 mg | ORAL_TABLET | Freq: Every day | ORAL | 5 refills | Status: DC

## 2017-10-25 NOTE — Patient Instructions (Addendum)
Medication Instructions:  Your physician has recommended you make the following change in your medication:  1) START Spironolactone 25mg  daily. An Rx has been sent to your pharmacy 2) START Lasix 20mg  daily. An Rx has been sent to your pharmacy   Labwork: Your physician recommends that you return for lab work Artist) the same day as your echocardiogram    Testing/Procedures: Echo already scheduled  Follow-Up: Follow up as planned  Any Other Special Instructions Will Be Listed Below (If Applicable).     If you need a refill on your cardiac medications before your next appointment, please call your pharmacy.

## 2017-10-30 ENCOUNTER — Ambulatory Visit (HOSPITAL_COMMUNITY): Payer: Commercial Managed Care - PPO | Attending: Cardiology

## 2017-10-30 ENCOUNTER — Other Ambulatory Visit: Payer: Self-pay

## 2017-10-30 ENCOUNTER — Other Ambulatory Visit: Payer: Commercial Managed Care - PPO

## 2017-10-30 DIAGNOSIS — E785 Hyperlipidemia, unspecified: Secondary | ICD-10-CM | POA: Insufficient documentation

## 2017-10-30 DIAGNOSIS — R002 Palpitations: Secondary | ICD-10-CM | POA: Diagnosis not present

## 2017-10-30 DIAGNOSIS — J45909 Unspecified asthma, uncomplicated: Secondary | ICD-10-CM | POA: Diagnosis not present

## 2017-10-30 DIAGNOSIS — I10 Essential (primary) hypertension: Secondary | ICD-10-CM | POA: Insufficient documentation

## 2017-10-30 DIAGNOSIS — I429 Cardiomyopathy, unspecified: Secondary | ICD-10-CM | POA: Insufficient documentation

## 2017-10-30 DIAGNOSIS — I5022 Chronic systolic (congestive) heart failure: Secondary | ICD-10-CM

## 2017-10-30 DIAGNOSIS — I456 Pre-excitation syndrome: Secondary | ICD-10-CM | POA: Diagnosis not present

## 2017-10-30 LAB — BASIC METABOLIC PANEL
BUN / CREAT RATIO: 29 — AB (ref 9–23)
BUN: 22 mg/dL (ref 6–24)
CO2: 25 mmol/L (ref 20–29)
Calcium: 10 mg/dL (ref 8.7–10.2)
Chloride: 98 mmol/L (ref 96–106)
Creatinine, Ser: 0.76 mg/dL (ref 0.57–1.00)
GFR calc Af Amer: 101 mL/min/{1.73_m2} (ref >59)
GFR calc non Af Amer: 87 mL/min/{1.73_m2} (ref >59)
GLUCOSE: 102 mg/dL — AB (ref 65–99)
Potassium: 5.4 mmol/L — ABNORMAL HIGH (ref 3.5–5.2)
SODIUM: 139 mmol/L (ref 134–144)

## 2017-10-30 MED ORDER — PERFLUTREN LIPID MICROSPHERE
1.0000 mL | INTRAVENOUS | Status: AC | PRN
  Administered 2017-10-30: 2 mL via INTRAVENOUS

## 2017-10-31 ENCOUNTER — Encounter: Payer: Self-pay | Admitting: *Deleted

## 2017-11-01 ENCOUNTER — Telehealth: Payer: Self-pay | Admitting: Cardiology

## 2017-11-01 DIAGNOSIS — Z79899 Other long term (current) drug therapy: Secondary | ICD-10-CM

## 2017-11-01 NOTE — Telephone Encounter (Signed)
Pt aware of her blood work and Echo result, BMP ordered, pt have appt to f/u at [facility] Oct 3rd, pt cancel apptt with Dr Percival Spanish on Oct 1st until she see cardiologist at [facility], she will call and reschdule

## 2017-11-01 NOTE — Telephone Encounter (Signed)
Adv pt that Dr.Hochrein has not reviewed the results as yet. We will call her with results when they become available. Pt verbalized understanding.

## 2017-11-01 NOTE — Telephone Encounter (Signed)
New Message   Patient is calling to obtain the results of her echocardiogram. Please call.

## 2017-11-01 NOTE — Telephone Encounter (Signed)
-----   Message from Minus Breeding, MD sent at 10/31/2017  9:28 AM EDT ----- Potassium was slightly elevated.  I would like to repeat this in two days.  Please call her and see if she feels less swollen with the diuretics started.

## 2017-11-02 ENCOUNTER — Ambulatory Visit: Payer: Self-pay | Admitting: Cardiology

## 2017-11-03 LAB — BASIC METABOLIC PANEL
BUN / CREAT RATIO: 22 (ref 9–23)
BUN: 16 mg/dL (ref 6–24)
CHLORIDE: 100 mmol/L (ref 96–106)
CO2: 23 mmol/L (ref 20–29)
CREATININE: 0.74 mg/dL (ref 0.57–1.00)
Calcium: 9.7 mg/dL (ref 8.7–10.2)
GFR calc non Af Amer: 90 mL/min/{1.73_m2} (ref >59)
GFR, EST AFRICAN AMERICAN: 104 mL/min/{1.73_m2} (ref >59)
Glucose: 118 mg/dL — ABNORMAL HIGH (ref 65–99)
Potassium: 5.5 mmol/L — ABNORMAL HIGH (ref 3.5–5.2)
Sodium: 139 mmol/L (ref 134–144)

## 2017-11-08 ENCOUNTER — Other Ambulatory Visit (HOSPITAL_COMMUNITY): Payer: Self-pay

## 2017-11-09 ENCOUNTER — Telehealth: Payer: Self-pay | Admitting: *Deleted

## 2017-11-09 DIAGNOSIS — Z79899 Other long term (current) drug therapy: Secondary | ICD-10-CM

## 2017-11-09 NOTE — Telephone Encounter (Signed)
BMP ordered

## 2017-11-09 NOTE — Telephone Encounter (Signed)
-----   Message from Minus Breeding, MD sent at 11/05/2017  9:20 AM EDT ----- Patient aware of results and that she should stop the spironolactone.  Continue Lasix.   Please call her to arrange a repeat BMET on Friday.

## 2017-11-12 LAB — BASIC METABOLIC PANEL
BUN / CREAT RATIO: 22 (ref 9–23)
BUN: 16 mg/dL (ref 6–24)
CALCIUM: 10.5 mg/dL — AB (ref 8.7–10.2)
CHLORIDE: 99 mmol/L (ref 96–106)
CO2: 24 mmol/L (ref 20–29)
Creatinine, Ser: 0.72 mg/dL (ref 0.57–1.00)
GFR calc non Af Amer: 93 mL/min/{1.73_m2} (ref >59)
GFR, EST AFRICAN AMERICAN: 108 mL/min/{1.73_m2} (ref >59)
GLUCOSE: 120 mg/dL — AB (ref 65–99)
Potassium: 5.1 mmol/L (ref 3.5–5.2)
SODIUM: 139 mmol/L (ref 134–144)

## 2017-11-13 ENCOUNTER — Ambulatory Visit: Payer: Self-pay | Admitting: Cardiology

## 2017-11-28 DIAGNOSIS — I5022 Chronic systolic (congestive) heart failure: Secondary | ICD-10-CM | POA: Insufficient documentation

## 2017-11-28 DIAGNOSIS — E785 Hyperlipidemia, unspecified: Secondary | ICD-10-CM | POA: Insufficient documentation

## 2017-11-28 DIAGNOSIS — I10 Essential (primary) hypertension: Secondary | ICD-10-CM | POA: Insufficient documentation

## 2017-11-28 DIAGNOSIS — I493 Ventricular premature depolarization: Secondary | ICD-10-CM | POA: Insufficient documentation

## 2017-11-28 DIAGNOSIS — J45909 Unspecified asthma, uncomplicated: Secondary | ICD-10-CM | POA: Insufficient documentation

## 2017-12-21 ENCOUNTER — Other Ambulatory Visit: Payer: Self-pay | Admitting: Family Medicine

## 2017-12-21 MED ORDER — SIMVASTATIN 20 MG PO TABS: 20.0000 mg | ORAL_TABLET | Freq: Every day | ORAL | 0 refills | Status: DC

## 2017-12-21 NOTE — Telephone Encounter (Signed)
Patient calling and states that she is needing a short supply(10 days) of this medication to be sent. [facility] that [facility] her refill, but she did not receive it. Please advise.  [id number] - [place], [place] - [address]

## 2017-12-24 ENCOUNTER — Encounter: Payer: Self-pay | Admitting: Family Medicine

## 2018-01-18 ENCOUNTER — Encounter: Payer: Self-pay | Admitting: Family Medicine

## 2018-02-12 ENCOUNTER — Ambulatory Visit: Payer: Self-pay | Admitting: Family Medicine

## 2018-02-18 ENCOUNTER — Telehealth: Payer: Self-pay

## 2018-02-18 NOTE — Telephone Encounter (Signed)
LMOVM for pt to call back/Is scheduled for 1.7.20 with Dr. Deborra Medina for back and hip pain/possibly would be more appropriate for pt to see Dr. Sidney Ace that they RTC to office and get put on his schedule if in agreement/thx dmf

## 2018-02-19 ENCOUNTER — Ambulatory Visit: Payer: Self-pay | Admitting: Family Medicine

## 2018-02-19 ENCOUNTER — Ambulatory Visit (INDEPENDENT_AMBULATORY_CARE_PROVIDER_SITE_OTHER): Payer: Commercial Managed Care - PPO | Admitting: Family Medicine

## 2018-02-19 ENCOUNTER — Other Ambulatory Visit: Payer: Self-pay | Admitting: Family Medicine

## 2018-02-19 ENCOUNTER — Encounter: Payer: Self-pay | Admitting: Family Medicine

## 2018-02-19 VITALS — BP 146/80 | HR 79 | Temp 98.3°F | Ht 61.0 in | Wt 197.4 lb

## 2018-02-19 DIAGNOSIS — M545 Low back pain, unspecified: Secondary | ICD-10-CM

## 2018-02-19 DIAGNOSIS — M722 Plantar fascial fibromatosis: Secondary | ICD-10-CM | POA: Insufficient documentation

## 2018-02-19 DIAGNOSIS — E7800 Pure hypercholesterolemia, unspecified: Secondary | ICD-10-CM

## 2018-02-19 MED ORDER — SIMVASTATIN 20 MG PO TABS: 20.0000 mg | ORAL_TABLET | Freq: Every day | ORAL | 3 refills | Status: DC

## 2018-02-19 NOTE — Progress Notes (Signed)
Renee Walters - 58 y.o. female MRN [id number]  Date of birth: [birth year]  SUBJECTIVE:  Including CC & ROS.  Chief Complaint  Patient presents with  . Back Pain    & hip pain     Renee Walters is a 58 y.o. female that is  Presenting with left heel pain, left lower back pain and hip pain. She has been walking since last May. She was diagnosed with HF and had started a walking regimen. Since that time she developed left heel pain initially. This pain is localized to the left heel. Worse with the first few steps. Has not had improvement with modalities to date. She sits at work but does have a standing desk. The low back pain is on the left lower side. Denies radicular symptoms.   Review of Systems  Constitutional: Negative for fever.  HENT: Negative for congestion.   Respiratory: Negative for cough.   Cardiovascular: Negative for chest pain.  Gastrointestinal: Negative for abdominal pain.  Musculoskeletal: Positive for back pain.  Skin: Negative for color change.  Neurological: Negative for weakness.  Hematological: Negative for adenopathy.  Psychiatric/Behavioral: Negative for agitation.    HISTORY: Past Medical, Surgical, Social, and Family History Reviewed & Updated per EMR.   Pertinent Historical Findings include:  Past Medical History:  Diagnosis Date  . Allergy   . Asthma   . Family history of early CAD 01/24/2012  . GERD (gastroesophageal reflux disease)    occ  . Hyperlipidemia   . Hypertension   . Leg cramping 03/21/2017  . Neuromuscular disorder (HCC)    sciatica  . Palpitation 11/05/2014  . Sciatica 03/21/2017  . Skin cancer   . WPW (Wolff-Parkinson-White syndrome)    s/p ablation in 1992    Past Surgical History:  Procedure Laterality Date  . ABDOMINAL HYSTERECTOMY    . CARDIAC CATHETERIZATION    . CARDIAC ELECTROPHYSIOLOGY STUDY AND ABLATION    . CHOLECYSTECTOMY    . COLONOSCOPY    . exploratory GYN surgery    . skin cancer removal     several  . TUBAL  LIGATION      Allergies  Allergen Reactions  . Codeine Nausea Only  . Other     Vicryl sutures blisters at site   . Penicillins Other (See Comments)    Passed out as a child    Family History  Problem Relation Age of Onset  . Arthritis Mother   . COPD Mother   . Diabetes Mother   . Hearing loss Mother   . Hypertension Mother   . Miscarriages / [place] Mother   . Heart disease Father 43       s/p CABG, stents  . Diabetes Father   . Heart attack Father   . Hyperlipidemia Father   . Hypertension Father   . Heart disease Paternal Grandfather   . Yves Dill Parkinson White syndrome Sister   . Cancer Sister   . Diabetes Sister   . Hyperlipidemia Sister   . Hypertension Sister   . Yves Dill Parkinson White syndrome Unknown        Sister's daughter  . Hyperlipidemia Brother   . Hypertension Brother   . Hyperlipidemia Brother   . Hypertension Brother   . Lung cancer Maternal Grandfather   . Colon polyps Paternal Grandmother   . Colon cancer Neg Hx   . Esophageal cancer Neg Hx   . Rectal cancer Neg Hx   . Stomach cancer Neg Hx  Social History   Socioeconomic History  . Marital status: Married    Spouse name: Not on file  . Number of children: 2  . Years of education: Not on file  . Highest education level: Not on file  Occupational History    Employer: [employer]  Social Needs  . Financial resource strain: Not on file  . Food insecurity:    Worry: Not on file    Inability: Not on file  . Transportation needs:    Medical: Not on file    Non-medical: Not on file  Tobacco Use  . Smoking status: Never Smoker  . Smokeless tobacco: Never Used  Substance and Sexual Activity  . Alcohol use: Yes    Frequency: Never    Comment: occasionally  . Drug use: No  . Sexual activity: Not on file  Lifestyle  . Physical activity:    Days per week: Not on file    Minutes per session: Not on file  . Stress: Not on file  Relationships  . Social connections:    Talks on  phone: Not on file    Gets together: Not on file    Attends religious service: Not on file    Active member of club or organization: Not on file    Attends meetings of clubs or organizations: Not on file    Relationship status: Not on file  . Intimate partner violence:    Fear of current or ex partner: Not on file    Emotionally abused: Not on file    Physically abused: Not on file    Forced sexual activity: Not on file  Other Topics Concern  . Not on file  Social History Narrative  . Not on file     PHYSICAL EXAM:  VS: BP (!) 146/80   Pulse 79   Temp 98.3 F (36.8 C) (Oral)   Ht 5\' 1"  (1.549 m)   Wt 197 lb 6.4 oz (89.5 kg)   SpO2 95%   BMI 37.30 kg/m  Physical Exam Gen: NAD, alert, cooperative with exam, well-appearing ENT: normal lips, normal nasal mucosa,  Eye: normal EOM, normal conjunctiva and lids CV:  no edema, +2 pedal pulses   Resp: no accessory muscle use, non-labored,  Skin: no rashes, no areas of induration  Neuro: normal tone, normal sensation to touch Psych:  normal insight, alert and oriented MSK:  Left heel:  No swelling or ecchymosis  No TTP at the achilles  No specific tenderness over the plantar heel  Normal ROM  Normal strength to resistance  Back/left hip:  No TTP over the GT or piriformis  Normal IR and ER of the hip  Normal strength to resistance with hip flexion, knee flexion and extension, plantarflexion and dorsalflexion  Negative SLR b/l  Neurovascularly intact      ASSESSMENT & PLAN:   Plantar fasciitis of left foot Symptoms seem suggestive of Plantar fasciitis. Has tightness in the calf on the left.  - counseled on HEP and supportive care - try a midfoot arch strap  - if no improvement consider imaging, injection, pt or custom orthotics.   Acute left-sided low back pain without sciatica Possible to be related with pain in her foot as that started first. Possible to be related to SI joint. No radicular symptoms  - counseled  on HEP and supportive care - if no improvement consider PT, imaging or trigger point injections.

## 2018-02-19 NOTE — Assessment & Plan Note (Signed)
Possible to be related with pain in her foot as that started first. Possible to be related to SI joint. No radicular symptoms  - counseled on HEP and supportive care - if no improvement consider PT, imaging or trigger point injections.

## 2018-02-19 NOTE — Patient Instructions (Signed)
Nice to meet you  Please try the midfoot arch strap. You can find this at [facility] Please try the exercises  Please try aspercream with lidocaine.  Please try riding a recumbent bike or working out in water instead of walking  Please see me back if you would like to try some custom orthotics  Please see me back in 3-4 weeks if no better

## 2018-02-19 NOTE — Assessment & Plan Note (Signed)
Symptoms seem suggestive of Plantar fasciitis. Has tightness in the calf on the left.  - counseled on HEP and supportive care - try a midfoot arch strap  - if no improvement consider imaging, injection, pt or custom orthotics.

## 2018-02-21 ENCOUNTER — Other Ambulatory Visit: Payer: Self-pay | Admitting: Family Medicine

## 2018-02-21 DIAGNOSIS — Z1231 Encounter for screening mammogram for malignant neoplasm of breast: Secondary | ICD-10-CM

## 2018-02-27 LAB — BASIC METABOLIC PANEL
BUN: 12 (ref 4–21)
CREATININE: 0.8 (ref 0.5–1.1)
GLUCOSE: 98
Potassium: 4.6 (ref 3.4–5.3)
Sodium: 140 (ref 137–147)

## 2018-02-28 DIAGNOSIS — I447 Left bundle-branch block, unspecified: Secondary | ICD-10-CM | POA: Insufficient documentation

## 2018-03-05 ENCOUNTER — Encounter: Payer: Self-pay | Admitting: Family Medicine

## 2018-03-19 ENCOUNTER — Encounter: Payer: Self-pay | Admitting: Family Medicine

## 2018-03-20 ENCOUNTER — Encounter: Payer: Self-pay | Admitting: Family Medicine

## 2018-03-20 ENCOUNTER — Ambulatory Visit (INDEPENDENT_AMBULATORY_CARE_PROVIDER_SITE_OTHER): Payer: Commercial Managed Care - PPO | Admitting: Family Medicine

## 2018-03-20 VITALS — BP 128/70 | HR 86 | Temp 100.2°F | Ht 61.0 in

## 2018-03-20 DIAGNOSIS — B349 Viral infection, unspecified: Secondary | ICD-10-CM | POA: Insufficient documentation

## 2018-03-20 DIAGNOSIS — R509 Fever, unspecified: Secondary | ICD-10-CM | POA: Insufficient documentation

## 2018-03-20 DIAGNOSIS — J45909 Unspecified asthma, uncomplicated: Secondary | ICD-10-CM | POA: Insufficient documentation

## 2018-03-20 DIAGNOSIS — J4521 Mild intermittent asthma with (acute) exacerbation: Secondary | ICD-10-CM

## 2018-03-20 LAB — POC INFLUENZA A&B (BINAX/QUICKVUE)
INFLUENZA A, POC: NEGATIVE
INFLUENZA B, POC: NEGATIVE

## 2018-03-20 MED ORDER — PREDNISONE 10 MG PO TABS: 10.0000 mg | ORAL_TABLET | Freq: Two times a day (BID) | ORAL | 0 refills | Status: AC

## 2018-03-20 MED ORDER — OSELTAMIVIR PHOSPHATE 75 MG PO CAPS: 75.0000 mg | ORAL_CAPSULE | Freq: Two times a day (BID) | ORAL | 0 refills | Status: AC

## 2018-03-20 MED ORDER — BENZONATATE 200 MG PO CAPS: 200.0000 mg | ORAL_CAPSULE | Freq: Two times a day (BID) | ORAL | 0 refills | Status: DC | PRN

## 2018-03-20 MED ORDER — ALBUTEROL SULFATE 108 (90 BASE) MCG/ACT IN AEPB: 1.0000 | INHALATION_SPRAY | Freq: Four times a day (QID) | RESPIRATORY_TRACT | 1 refills | Status: DC | PRN

## 2018-03-20 NOTE — Progress Notes (Signed)
Established Patient Office Visit  Subjective:  Patient ID: Renee Walters, female    DOB: 1960-04-08  Age: 58 y.o. MRN: [id number]  CC:  Chief Complaint  Patient presents with  . Generalized Body Aches  . Fever    HPI DEONI COSEY presents for evaluation and treatment of a 1 day history of fever with chills, headache, postnasal drip sore throat cough with wheezing.  There is been myalgias and arthralgias.  Husband recently discharged from the hospital [facility] [place] to bronchitis with hypoxia and type a influenza.  Patient with current diagnosis of heart failure with a 35% ejection fraction and a left bundle branch block.  She does have an asthma history.  Past Medical History:  Diagnosis Date  . Allergy   . Asthma   . Family history of early CAD 01/24/2012  . GERD (gastroesophageal reflux disease)    occ  . Hyperlipidemia   . Hypertension   . Leg cramping 03/21/2017  . Neuromuscular disorder (HCC)    sciatica  . Palpitation 11/05/2014  . Sciatica 03/21/2017  . Skin cancer   . WPW (Wolff-Parkinson-White syndrome)    s/p ablation in 1992    Past Surgical History:  Procedure Laterality Date  . ABDOMINAL HYSTERECTOMY    . CARDIAC CATHETERIZATION    . CARDIAC ELECTROPHYSIOLOGY STUDY AND ABLATION    . CHOLECYSTECTOMY    . COLONOSCOPY    . exploratory GYN surgery    . skin cancer removal     several  . TUBAL LIGATION      Family History  Problem Relation Age of Onset  . Arthritis Mother   . COPD Mother   . Diabetes Mother   . Hearing loss Mother   . Hypertension Mother   . Miscarriages / [place] Mother   . Heart disease Father 69       s/p CABG, stents  . Diabetes Father   . Heart attack Father   . Hyperlipidemia Father   . Hypertension Father   . Heart disease Paternal Grandfather   . Yves Dill Parkinson White syndrome Sister   . Cancer Sister   . Diabetes Sister   . Hyperlipidemia Sister   . Hypertension Sister   . Yves Dill Parkinson White syndrome Unknown          Sister's daughter  . Hyperlipidemia Brother   . Hypertension Brother   . Hyperlipidemia Brother   . Hypertension Brother   . Lung cancer Maternal Grandfather   . Colon polyps Paternal Grandmother   . Colon cancer Neg Hx   . Esophageal cancer Neg Hx   . Rectal cancer Neg Hx   . Stomach cancer Neg Hx     Social History   Socioeconomic History  . Marital status: Married    Spouse name: Not on file  . Number of children: 2  . Years of education: Not on file  . Highest education level: Not on file  Occupational History    Employer: [employer]  Social Needs  . Financial resource strain: Not on file  . Food insecurity:    Worry: Not on file    Inability: Not on file  . Transportation needs:    Medical: Not on file    Non-medical: Not on file  Tobacco Use  . Smoking status: Never Smoker  . Smokeless tobacco: Never Used  Substance and Sexual Activity  . Alcohol use: Yes    Frequency: Never    Comment: occasionally  . Drug use: No  .  Sexual activity: Not on file  Lifestyle  . Physical activity:    Days per week: Not on file    Minutes per session: Not on file  . Stress: Not on file  Relationships  . Social connections:    Talks on phone: Not on file    Gets together: Not on file    Attends religious service: Not on file    Active member of club or organization: Not on file    Attends meetings of clubs or organizations: Not on file    Relationship status: Not on file  . Intimate partner violence:    Fear of current or ex partner: Not on file    Emotionally abused: Not on file    Physically abused: Not on file    Forced sexual activity: Not on file  Other Topics Concern  . Not on file  Social History Narrative  . Not on file    Outpatient Medications Prior to Visit  Medication Sig Dispense Refill  . cetirizine (ZYRTEC) 10 MG tablet Take 10 mg by mouth daily.    . Cholecalciferol (VITAMIN D3) 50000 units TABS Take 50,000 Units by mouth once a week. 1 tab po  weekly for 6 weeks. 6 tablet 0  . cyclobenzaprine (FLEXERIL) 5 MG tablet Take 1 tablet (5 mg total) by mouth 3 (three) times daily as needed for muscle spasms. 30 tablet 1  . ibuprofen (ADVIL,MOTRIN) 200 MG tablet Take 600 mg by mouth every 6 (six) hours as needed.    . metoprolol succinate (TOPROL-XL) 100 MG 24 hr tablet Take 1 tablet (100 mg total) by mouth daily. Take with or immediately following a meal. 90 tablet 3  . sacubitril-valsartan (ENTRESTO) 49-51 MG Take 1 tablet by mouth 2 (two) times daily. 60 tablet 11  . simvastatin (ZOCOR) 20 MG tablet Take 1 tablet (20 mg total) by mouth at bedtime. 90 tablet 3  . albuterol (PROVENTIL HFA;VENTOLIN HFA) 108 (90 Base) MCG/ACT inhaler Inhale 2 puffs into the lungs every 6 (six) hours as needed. 1 Inhaler 0  . furosemide (LASIX) 20 MG tablet Take 1 tablet (20 mg total) by mouth daily. 30 tablet 5  . metoprolol tartrate (LOPRESSOR) 25 MG tablet Take 1 tablet (25 mg total) by mouth daily at 3 pm. 180 tablet 3  . spironolactone (ALDACTONE) 25 MG tablet Take 1 tablet (25 mg total) by mouth daily. 30 tablet 5   Facility-Administered Medications Prior to Visit  Medication Dose Route Frequency Provider Last Rate Last Dose  . 0.9 %  sodium chloride infusion  500 mL Intravenous Once Milus Banister, MD        Allergies  Allergen Reactions  . Codeine Nausea Only  . Other     Vicryl sutures blisters at site   . Penicillins Other (See Comments)    Passed out as a child    ROS Review of Systems  Constitutional: Positive for chills, diaphoresis, fatigue and fever. Negative for unexpected weight change.  HENT: Positive for congestion, postnasal drip and sore throat.   Eyes: Negative.   Respiratory: Positive for cough and wheezing. Negative for shortness of breath.   Cardiovascular: Negative for chest pain.  Gastrointestinal: Negative for diarrhea and vomiting.  Genitourinary: Negative.   Musculoskeletal: Positive for arthralgias and myalgias.    Neurological: Positive for headaches. Negative for weakness.  Hematological: Does not bruise/bleed easily.  Psychiatric/Behavioral: Negative.       Objective:    Physical Exam  Constitutional: She is  oriented to person, place, and time. She appears well-developed and well-nourished. No distress.  HENT:  Head: Normocephalic and atraumatic.  Right Ear: External ear normal.  Left Ear: External ear normal.  Mouth/Throat: Oropharynx is clear and moist. No oropharyngeal exudate.  Eyes: Pupils are equal, round, and reactive to light. Conjunctivae are normal. Right eye exhibits no discharge. Left eye exhibits no discharge. No scleral icterus.  Neck: Neck supple. No JVD present. No tracheal deviation present. No thyromegaly present.  Cardiovascular: Normal rate, regular rhythm and normal heart sounds.  Pulmonary/Chest: Effort normal and breath sounds normal. No stridor. She has no decreased breath sounds. She has no wheezes. She has no rhonchi. She has no rales.  Abdominal: Bowel sounds are normal.  Lymphadenopathy:    She has no cervical adenopathy.  Neurological: She is alert and oriented to person, place, and time.  Skin: Skin is warm and dry. She is not diaphoretic.  Psychiatric: She has a normal mood and affect. Her behavior is normal.    BP 128/70   Pulse 86   Temp 100.2 F (37.9 C) (Oral)   Ht 5\' 1"  (1.549 m)   SpO2 97%   BMI 37.30 kg/m  Wt Readings from Last 3 Encounters:  02/19/18 197 lb 6.4 oz (89.5 kg)  10/25/17 190 lb (86.2 kg)  09/07/17 182 lb 9.6 oz (82.8 kg)   BP Readings from Last 3 Encounters:  03/20/18 128/70  02/19/18 (!) 146/80  10/25/17 138/70   Guideline developer:  UpToDate (see UpToDate for funding source) Date Released: June 2014  Health Maintenance Due  Topic Date Due  . Hepatitis C Screening  11/24/1960  . HIV Screening  09/21/1975    There are no preventive care reminders to display for this patient.  Lab Results  Component Value Date    TSH 3.32 03/21/2017   Lab Results  Component Value Date   WBC 6.1 03/21/2017   HGB 13.0 03/21/2017   HCT 40.3 03/21/2017   MCV 88.6 03/21/2017   PLT 292.0 03/21/2017   Lab Results  Component Value Date   NA 140 02/27/2018   K 4.6 02/27/2018   CO2 24 11/12/2017   GLUCOSE 120 (H) 11/12/2017   BUN 12 02/27/2018   CREATININE 0.8 02/27/2018   BILITOT 0.6 03/21/2017   ALKPHOS 80 03/21/2017   AST 16 03/21/2017   ALT 20 03/21/2017   PROT 6.9 03/21/2017   ALBUMIN 4.4 03/21/2017   CALCIUM 10.5 (H) 11/12/2017   GFR 98.29 03/21/2017   Lab Results  Component Value Date   CHOL 270 (H) 01/02/2017   Lab Results  Component Value Date   HDL 67.60 01/02/2017   Lab Results  Component Value Date   LDLCALC 178 (H) 11/16/2014   Lab Results  Component Value Date   TRIG 240.0 (H) 01/02/2017   Lab Results  Component Value Date   CHOLHDL 4 01/02/2017   No results found for: HGBA1C    Assessment & Plan:   Problem List Items Addressed This Visit      Respiratory   Reactive airway disease   Relevant Medications   predniSONE (DELTASONE) 10 MG tablet   benzonatate (TESSALON) 200 MG capsule   Albuterol Sulfate (PROAIR RESPICLICK) 408 (90 Base) MCG/ACT AEPB     Other   Fever - Primary   Relevant Orders   POC Influenza A&B(BINAX/QUICKVUE) (Completed)   Viral syndrome   Relevant Medications   oseltamivir (TAMIFLU) 75 MG capsule   benzonatate (TESSALON) 200 MG capsule  Meds ordered this encounter  Medications  . oseltamivir (TAMIFLU) 75 MG capsule    Sig: Take 1 capsule (75 mg total) by mouth 2 (two) times daily for 5 days.    Dispense:  10 capsule    Refill:  0  . predniSONE (DELTASONE) 10 MG tablet    Sig: Take 1 tablet (10 mg total) by mouth 2 (two) times daily with a meal for 5 days.    Dispense:  10 tablet    Refill:  0  . benzonatate (TESSALON) 200 MG capsule    Sig: Take 1 capsule (200 mg total) by mouth 2 (two) times daily as needed for cough.    Dispense:   20 capsule    Refill:  0  . Albuterol Sulfate (PROAIR RESPICLICK) 481 (90 Base) MCG/ACT AEPB    Sig: Inhale 1 puff into the lungs 4 (four) times daily as needed.    Dispense:  1 each    Refill:  1    Follow-up: Return in about 5 days (around 03/25/2018), or if symptoms worsen or fail to improve.

## 2018-03-20 NOTE — Patient Instructions (Signed)
Viral Respiratory Infection A respiratory infection is an illness that affects part of the respiratory system, such as the lungs, nose, or throat. A respiratory infection that is caused by a virus is called a viral respiratory infection. Common types of viral respiratory infections include:  A cold.  The flu (influenza).  A respiratory syncytial virus (RSV) infection. What are the causes? This condition is caused by a virus. What are the signs or symptoms? Symptoms of this condition include:  A stuffy or runny nose.  Yellow or green nasal discharge.  A cough.  Sneezing.  Fatigue.  Achy muscles.  A sore throat.  Sweating or chills.  A fever.  A headache. How is this diagnosed? This condition may be diagnosed based on:  Your symptoms.  A physical exam.  Testing of nasal swabs. How is this treated? This condition may be treated with medicines, such as:  Antiviral medicine. This may shorten the length of time a person has symptoms.  Expectorants. These make it easier to cough up mucus.  Decongestant nasal sprays.  Acetaminophen or NSAIDs to relieve fever and pain. Antibiotic medicines are not prescribed for viral infections. This is because antibiotics are designed to kill bacteria. They are not effective against viruses. Follow these instructions at home:  Managing pain and congestion  Take over-the-counter and prescription medicines only as told by your health care provider.  If you have a sore throat, gargle with a salt-water mixture 3-4 times a day or as needed. To make a salt-water mixture, completely dissolve -1 tsp of salt in 1 cup of warm water.  Use nose drops made from salt water to ease congestion and soften raw skin around your nose.  Drink enough fluid to keep your urine pale yellow. This helps prevent dehydration and helps loosen up mucus. General instructions  Rest as much as possible.  Do not drink alcohol.  Do not use any products  that contain nicotine or tobacco, such as cigarettes and e-cigarettes. If you need help quitting, ask your health care provider.  Keep all follow-up visits as told by your health care provider. This is important. How is this prevented?   Get an annual flu shot. You may get the flu shot in late summer, fall, or winter. Ask your health care provider when you should get your flu shot.  Avoid exposing others to your respiratory infection. ? Stay home from work or school as told by your health care provider. ? Wash your hands with soap and water often, especially after you cough or sneeze. If soap and water are not available, use alcohol-based hand sanitizer.  Avoid contact with people who are sick during cold and flu season. This is generally fall and winter. Contact a health care provider if:  Your symptoms last for 10 days or longer.  Your symptoms get worse over time.  You have a fever.  You have severe sinus pain in your face or forehead.  The glands in your jaw or neck become very swollen. Get help right away if you:  Feel pain or pressure in your chest.  Have shortness of breath.  Faint or feel like you will faint.  Have severe and persistent vomiting.  Feel confused or disoriented. Summary  A respiratory infection is an illness that affects part of the respiratory system, such as the lungs, nose, or throat. A respiratory infection that is caused by a virus is called a viral respiratory infection.  Common types of viral respiratory infections are a   cold, influenza, and respiratory syncytial virus (RSV) infection.  Symptoms of this condition include a stuffy or runny nose, cough, sneezing, fatigue, achy muscles, sore throat, and fevers or chills.  Antibiotic medicines are not prescribed for viral infections. This is because antibiotics are designed to kill bacteria. They are not effective against viruses. This information is not intended to replace advice given to you by  your health care provider. Make sure you discuss any questions you have with your health care provider. Document Released: 11/09/2004 Document Revised: 03/12/2017 Document Reviewed: 03/12/2017 Elsevier Interactive Patient Education  2019 [place].  Asthma, Adult  Asthma is a long-term (chronic) condition that causes recurrent episodes in which the airways become tight and narrow. The airways are the passages that lead from the nose and mouth down into the lungs. Asthma episodes, also called asthma attacks, can cause coughing, wheezing, shortness of breath, and chest pain. The airways can also fill with mucus. During an attack, it can be difficult to breathe. Asthma attacks can range from minor to life threatening. Asthma cannot be cured, but medicines and lifestyle changes can help control it and treat acute attacks. What are the causes? This condition is believed to be caused by inherited (genetic) and environmental factors, but its exact cause is not known. There are many things that can bring on an asthma attack or make asthma symptoms worse (triggers). Asthma triggers are different for each person. Common triggers include:  Mold.  Dust.  Cigarette smoke.  Cockroaches.  Things that can cause allergy symptoms (allergens), such as animal dander or pollen from trees or grass.  Air pollutants such as household cleaners, wood smoke, smog, or Advertising account planner.  Cold air, weather changes, and winds (which increase molds and pollen in the air).  Strong emotional expressions such as crying or laughing hard.  Stress.  Certain medicines (such as aspirin) or types of medicines (such as beta-blockers).  Sulfites in foods and drinks. Foods and drinks that may contain sulfites include dried fruit, potato chips, and sparkling grape juice.  Infections or inflammatory conditions such as the flu, a cold, or inflammation of the nasal membranes (rhinitis).  Gastroesophageal reflux disease  (GERD).  Exercise or strenuous activity. What are the signs or symptoms? Symptoms of this condition may occur right after asthma is triggered or many hours later. Symptoms include:  Wheezing. This can sound like whistling when you breathe.  Excessive nighttime or early morning coughing.  Frequent or severe coughing with a common cold.  Chest tightness.  Shortness of breath.  Tiredness (fatigue) with minimal activity. How is this diagnosed? This condition is diagnosed based on:  Your medical history.  A physical exam.  Tests, which may include: ? Lung function studies and pulmonary studies (spirometry). These tests can evaluate the flow of air in your lungs. ? Allergy tests. ? Imaging tests, such as X-rays. How is this treated? There is no cure for this condition, but treatment can help control your symptoms. Treatment for asthma usually involves:  Identifying and avoiding your asthma triggers.  Using medicines to control your symptoms. Generally, two types of medicines are used to treat asthma: ? Controller medicines. These help prevent asthma symptoms from occurring. They are usually taken every day. ? Fast-acting reliever or rescue medicines. These quickly relieve asthma symptoms by widening the narrow and tight airways. They are used as needed and provide short-term relief.  Using supplemental oxygen. This may be needed during a severe episode.  Using other medicines, such as: ?  Allergy medicines, such as antihistamines, if your asthma attacks are triggered by allergens. ? Immune medicines (immunomodulators). These are medicines that help control the immune system.  Creating an asthma action plan. An asthma action plan is a written plan for managing and treating your asthma attacks. This plan includes: ? A list of your asthma triggers and how to avoid them. ? Information about when medicines should be taken and when their dosage should be changed. ? Instructions about  using a device called a peak flow meter. A peak flow meter measures how well the lungs are working and the severity of your asthma. It helps you monitor your condition. Follow these instructions at home: Controlling your home environment Control your home environment in the following ways to help avoid triggers and prevent asthma attacks:  Change your heating and air conditioning filter regularly.  Limit your use of fireplaces and wood stoves.  Get rid of pests (such as roaches and mice) and their droppings.  Throw away plants if you see mold on them.  Clean floors and dust surfaces regularly. Use unscented cleaning products.  Try to have someone else vacuum for you regularly. Stay out of rooms while they are being vacuumed and for a short while afterward. If you vacuum, use a dust mask from a hardware store, a double-layered or microfilter vacuum cleaner bag, or a vacuum cleaner with a HEPA filter.  Replace carpet with wood, tile, or vinyl flooring. Carpet can trap dander and dust.  Use allergy-proof pillows, mattress covers, and box spring covers.  Keep your bedroom a trigger-free room.  Avoid pets and keep windows closed when allergens are in the air.  Wash beddings every week in hot water and dry them in a dryer.  Use blankets that are made of polyester or cotton.  Clean bathrooms and kitchens with bleach. If possible, have someone repaint the walls in these rooms with mold-resistant paint. Stay out of the rooms that are being cleaned and painted.  Wash your hands often with soap and water. If soap and water are not available, use hand sanitizer.  Do not allow anyone to smoke in your home. General instructions  Take over-the-counter and prescription medicines only as told by your health care provider. ? Speak with your health care provider if you have questions about how or when to take the medicines. ? Make note if you are requiring more frequent dosages.  Do not use any  products that contain nicotine or tobacco, such as cigarettes and e-cigarettes. If you need help quitting, ask your health care provider. Also, avoid being exposed to secondhand smoke.  Use a peak flow meter as told by your health care provider. Record and keep track of the readings.  Understand and use the asthma action plan to help minimize, or stop an asthma attack, without needing to seek medical care.  Make sure you stay up to date on your yearly vaccinations as told by your health care provider. This may include vaccines for the flu and pneumonia.  Avoid outdoor activities when allergen counts are high and when air quality is low.  Wear a ski mask that covers your nose and mouth during outdoor winter activities. Exercise indoors on cold days if you can.  Warm up before exercising, and take time for a cool-down period after exercise.  Keep all follow-up visits as told by your health care provider. This is important. Where to find more information  For information about asthma, turn to the Centers for  Disease Control and Prevention at http://www.clark.net/.htm  For air quality information, turn to [employer] at WeightRating.nl Contact a health care provider if:  You have wheezing, shortness of breath, or a cough even while you are taking medicine to prevent attacks.  The mucus you cough up (sputum) is thicker than usual.  Your sputum changes from clear or white to yellow, green, gray, or bloody.  Your medicines are causing side effects, such as a rash, itching, swelling, or trouble breathing.  You need to use a reliever medicine more than 2-3 times a week.  Your peak flow reading is still at 50-79% of your personal best after following your action plan for 1 hour.  You have a fever. Get help right away if:  You are getting worse and do not respond to treatment during an asthma attack.  You are short of breath when at rest or when doing very little physical  activity.  You have difficulty eating, drinking, or talking.  You have chest pain or tightness.  You develop a fast heartbeat or palpitations.  You have a bluish color to your lips or fingernails.  You are light-headed or dizzy, or you faint.  Your peak flow reading is less than 50% of your personal best.  You feel too tired to breathe normally. Summary  Asthma is a long-term (chronic) condition that causes recurrent episodes in which the airways become tight and narrow. These episodes can cause coughing, wheezing, shortness of breath, and chest pain.  Asthma cannot be cured, but medicines and lifestyle changes can help control it and treat acute attacks.  Make sure you understand how to avoid triggers and how and when to use your medicines.  Asthma attacks can range from minor to life threatening. Get help right away if you have an asthma attack and do not respond to treatment with your usual rescue medicines. This information is not intended to replace advice given to you by your health care provider. Make sure you discuss any questions you have with your health care provider. Document Released: 01/30/2005 Document Revised: 03/06/2016 Document Reviewed: 03/06/2016 Elsevier Interactive Patient Education  2019 [employer].

## 2018-03-26 ENCOUNTER — Ambulatory Visit
Admission: RE | Admit: 2018-03-26 | Discharge: 2018-03-26 | Disposition: A | Discharge status: Home / Self Care | Payer: Commercial Managed Care - PPO | Source: Ambulatory Visit | Attending: Family Medicine | Admitting: Family Medicine

## 2018-03-26 DIAGNOSIS — Z1231 Encounter for screening mammogram for malignant neoplasm of breast: Secondary | ICD-10-CM

## 2018-04-10 ENCOUNTER — Ambulatory Visit (INDEPENDENT_AMBULATORY_CARE_PROVIDER_SITE_OTHER): Payer: Commercial Managed Care - PPO

## 2018-04-10 DIAGNOSIS — Z23 Encounter for immunization: Secondary | ICD-10-CM | POA: Diagnosis not present

## 2018-04-10 NOTE — Progress Notes (Signed)
Patient came into the office to receive her pneumonia vaccine. Patient received the vaccine in her left deltoid & tolerated the injection well. No signs/symptoms of a reaction prior to leaving the exam room. VIS given to patient. Patient was advised to get the injection by Dr. Ethelene Hal at her last office visit. Verbal obtained from Dr. Ethelene Hal to give injection to patient.

## 2018-04-16 ENCOUNTER — Encounter: Payer: Self-pay | Admitting: Family Medicine

## 2018-04-16 ENCOUNTER — Ambulatory Visit (INDEPENDENT_AMBULATORY_CARE_PROVIDER_SITE_OTHER): Payer: Commercial Managed Care - PPO | Admitting: Family Medicine

## 2018-04-16 ENCOUNTER — Ambulatory Visit (INDEPENDENT_AMBULATORY_CARE_PROVIDER_SITE_OTHER): Payer: Commercial Managed Care - PPO

## 2018-04-16 VITALS — BP 132/88 | HR 76 | Temp 98.3°F | Ht 61.0 in | Wt 197.0 lb

## 2018-04-16 DIAGNOSIS — M722 Plantar fascial fibromatosis: Secondary | ICD-10-CM

## 2018-04-16 NOTE — Patient Instructions (Signed)
Good to see you  Please continue the stretching and ice after today  Please see me back in 2-3 weeks if no better.

## 2018-04-16 NOTE — Progress Notes (Signed)
Renee Walters - 58 y.o. female MRN [id number]  Date of birth: [birth year]  SUBJECTIVE:  Including CC & ROS.  Chief Complaint  Patient presents with  . Pain    left heel pain/ no better from last visit/ requesting injection    Renee Walters is a 58 y.o. female that is presenting with acute on chronic left plantar heel pain.  History plantar fasciitis.  Pain is been present for over a year.  Pain is worse with ambulation.  It is becoming more constant.  It is moderate to severe.  It is localized to the heel.  Denies any specific inciting event.  Has tried several conservative measures with no improvement.  Pain is sharp and stabbing.    Review of Systems  Constitutional: Negative for fever.  HENT: Negative for congestion.   Respiratory: Negative for cough.   Cardiovascular: Negative for chest pain.  Gastrointestinal: Negative for abdominal pain.  Musculoskeletal: Positive for gait problem.  Skin: Negative for color change.  Neurological: Negative for weakness.  Hematological: Negative for adenopathy.  Psychiatric/Behavioral: Negative for agitation.    HISTORY: Past Medical, Surgical, Social, and Family History Reviewed & Updated per EMR.   Pertinent Historical Findings include:  Past Medical History:  Diagnosis Date  . Allergy   . Asthma   . Family history of early CAD 01/24/2012  . GERD (gastroesophageal reflux disease)    occ  . Hyperlipidemia   . Hypertension   . Leg cramping 03/21/2017  . Neuromuscular disorder (HCC)    sciatica  . Palpitation 11/05/2014  . Sciatica 03/21/2017  . Skin cancer   . WPW (Wolff-Parkinson-White syndrome)    s/p ablation in 1992    Past Surgical History:  Procedure Laterality Date  . ABDOMINAL HYSTERECTOMY    . CARDIAC CATHETERIZATION    . CARDIAC ELECTROPHYSIOLOGY STUDY AND ABLATION    . CHOLECYSTECTOMY    . COLONOSCOPY    . exploratory GYN surgery    . skin cancer removal     several  . TUBAL LIGATION      Allergies  Allergen  Reactions  . Codeine Nausea Only  . Other     Vicryl sutures blisters at site   . Penicillins Other (See Comments)    Passed out as a child    Family History  Problem Relation Age of Onset  . Arthritis Mother   . COPD Mother   . Diabetes Mother   . Hearing loss Mother   . Hypertension Mother   . Miscarriages / [place] Mother   . Heart disease Father 69       s/p CABG, stents  . Diabetes Father   . Heart attack Father   . Hyperlipidemia Father   . Hypertension Father   . Heart disease Paternal Grandfather   . Yves Dill Parkinson White syndrome Sister   . Cancer Sister   . Diabetes Sister   . Hyperlipidemia Sister   . Hypertension Sister   . Yves Dill Parkinson White syndrome Unknown        Sister's daughter  . Hyperlipidemia Brother   . Hypertension Brother   . Hyperlipidemia Brother   . Hypertension Brother   . Lung cancer Maternal Grandfather   . Colon polyps Paternal Grandmother   . Colon cancer Neg Hx   . Esophageal cancer Neg Hx   . Rectal cancer Neg Hx   . Stomach cancer Neg Hx      Social History   Socioeconomic History  . Marital status:  Married    Spouse name: Not on file  . Number of children: 2  . Years of education: Not on file  . Highest education level: Not on file  Occupational History    Employer: [employer]  Social Needs  . Financial resource strain: Not on file  . Food insecurity:    Worry: Not on file    Inability: Not on file  . Transportation needs:    Medical: Not on file    Non-medical: Not on file  Tobacco Use  . Smoking status: Never Smoker  . Smokeless tobacco: Never Used  Substance and Sexual Activity  . Alcohol use: Yes    Frequency: Never    Comment: occasionally  . Drug use: No  . Sexual activity: Not on file  Lifestyle  . Physical activity:    Days per week: Not on file    Minutes per session: Not on file  . Stress: Not on file  Relationships  . Social connections:    Talks on phone: Not on file    Gets together:  Not on file    Attends religious service: Not on file    Active member of club or organization: Not on file    Attends meetings of clubs or organizations: Not on file    Relationship status: Not on file  . Intimate partner violence:    Fear of current or ex partner: Not on file    Emotionally abused: Not on file    Physically abused: Not on file    Forced sexual activity: Not on file  Other Topics Concern  . Not on file  Social History Narrative  . Not on file     PHYSICAL EXAM:  VS: BP 132/88   Pulse 76   Temp 98.3 F (36.8 C) (Oral)   Ht 5\' 1"  (1.549 m)   Wt 197 lb (89.4 kg)   SpO2 96%   BMI 37.22 kg/m  Physical Exam Gen: NAD, alert, cooperative with exam, well-appearing ENT: normal lips, normal nasal mucosa,  Eye: normal EOM, normal conjunctiva and lids CV:  no edema, +2 pedal pulses   Resp: no accessory muscle use, non-labored,  Skin: no rashes, no areas of induration  Neuro: normal tone, normal sensation to touch Psych:  normal insight, alert and oriented MSK:  Left foot:  No ecchymosis or swelling TTP at the calcaneous  Normal plantarflexion  Mild limitation with dorsalflexion  Neurovascularly intact    Aspiration/Injection Procedure Note [name] 1961/01/21  Procedure: Injection Indications: left heel pain   Procedure Details Consent: Risks of procedure as well as the alternatives and risks of each were explained to the (patient/caregiver).  Consent for procedure obtained. Time Out: Verified patient identification, verified procedure, site/side was marked, verified correct patient position, special equipment/implants available, medications/allergies/relevent history reviewed, required imaging and test results available.  Performed.  The area was cleaned with iodine and alcohol swabs.    The left plantar fascia was injected using 1 cc's of 40 mg depomedrol and 2 cc's of 0.25% bupivacaine with a 25 1 1/2" needle.  Ultrasound was used. Images were  obtained in long views showing the injection.     A sterile dressing was applied.  Patient did tolerate procedure well.        ASSESSMENT & PLAN:   Plantar fasciitis of left foot Acute on chronic worsening of her plantar fasciitis.  Is no improvement with conservative measures. -Injection today. -Counseled on supportive care. -Can follow-up with  consider custom orthotics

## 2018-04-16 NOTE — Assessment & Plan Note (Signed)
Acute on chronic worsening of her plantar fasciitis.  Is no improvement with conservative measures. -Injection today. -Counseled on supportive care. -Can follow-up with consider custom orthotics

## 2018-04-22 ENCOUNTER — Encounter: Payer: Self-pay | Admitting: Family Medicine

## 2018-04-24 NOTE — Telephone Encounter (Signed)
Pt states the form needs to be re-faxed to [phone]. Please advise.

## 2018-07-10 ENCOUNTER — Other Ambulatory Visit: Payer: Self-pay | Admitting: Dermatology

## 2018-07-15 HISTORY — PX: OTHER SURGICAL HISTORY: SHX169

## 2018-08-01 DIAGNOSIS — Z95810 Presence of automatic (implantable) cardiac defibrillator: Secondary | ICD-10-CM | POA: Insufficient documentation

## 2018-08-09 DIAGNOSIS — Z95810 Presence of automatic (implantable) cardiac defibrillator: Secondary | ICD-10-CM | POA: Insufficient documentation

## 2018-08-09 DIAGNOSIS — Z1159 Encounter for screening for other viral diseases: Secondary | ICD-10-CM | POA: Insufficient documentation

## 2018-08-12 ENCOUNTER — Ambulatory Visit: Payer: Commercial Managed Care - PPO | Admitting: Family Medicine

## 2018-09-23 ENCOUNTER — Encounter: Payer: Self-pay | Admitting: Family Medicine

## 2018-10-29 ENCOUNTER — Other Ambulatory Visit: Payer: Self-pay | Admitting: Cardiology

## 2019-01-06 ENCOUNTER — Other Ambulatory Visit: Payer: Self-pay

## 2019-01-06 DIAGNOSIS — Z20822 Contact with and (suspected) exposure to covid-19: Secondary | ICD-10-CM

## 2019-01-07 LAB — NOVEL CORONAVIRUS, NAA: SARS-CoV-2, NAA: NOT DETECTED

## 2019-01-27 ENCOUNTER — Other Ambulatory Visit: Payer: Self-pay | Admitting: Cardiology

## 2019-01-30 NOTE — Telephone Encounter (Signed)
Lmovm to contact office to schedule future appointment with Dr. Percival Spanish and to verify if pt will be followed by [facility] or coming to see Hochrein for her general cardiac needs. Pt hasn't been seen since 10/25/2017.

## 2019-02-05 ENCOUNTER — Encounter (HOSPITAL_COMMUNITY): Payer: Self-pay

## 2019-02-05 ENCOUNTER — Emergency Department (HOSPITAL_COMMUNITY): Payer: Commercial Managed Care - PPO

## 2019-02-05 ENCOUNTER — Emergency Department (HOSPITAL_COMMUNITY)
Admission: EM | Admit: 2019-02-05 | Discharge: 2019-02-05 | Disposition: A | Discharge status: Home / Self Care | Payer: Commercial Managed Care - PPO | Attending: Emergency Medicine | Admitting: Emergency Medicine

## 2019-02-05 ENCOUNTER — Other Ambulatory Visit: Payer: Self-pay

## 2019-02-05 DIAGNOSIS — R0602 Shortness of breath: Secondary | ICD-10-CM | POA: Diagnosis not present

## 2019-02-05 DIAGNOSIS — R42 Dizziness and giddiness: Secondary | ICD-10-CM | POA: Diagnosis present

## 2019-02-05 DIAGNOSIS — R0789 Other chest pain: Secondary | ICD-10-CM | POA: Insufficient documentation

## 2019-02-05 DIAGNOSIS — Z8709 Personal history of other diseases of the respiratory system: Secondary | ICD-10-CM | POA: Insufficient documentation

## 2019-02-05 DIAGNOSIS — Z85828 Personal history of other malignant neoplasm of skin: Secondary | ICD-10-CM | POA: Insufficient documentation

## 2019-02-05 DIAGNOSIS — Z950 Presence of cardiac pacemaker: Secondary | ICD-10-CM | POA: Insufficient documentation

## 2019-02-05 DIAGNOSIS — R55 Syncope and collapse: Secondary | ICD-10-CM | POA: Diagnosis not present

## 2019-02-05 DIAGNOSIS — Z79899 Other long term (current) drug therapy: Secondary | ICD-10-CM | POA: Diagnosis not present

## 2019-02-05 DIAGNOSIS — Z20828 Contact with and (suspected) exposure to other viral communicable diseases: Secondary | ICD-10-CM | POA: Insufficient documentation

## 2019-02-05 DIAGNOSIS — I959 Hypotension, unspecified: Secondary | ICD-10-CM | POA: Insufficient documentation

## 2019-02-05 LAB — CBC
HCT: 41.9 % (ref 36.0–46.0)
Hemoglobin: 13 g/dL (ref 12.0–15.0)
MCH: 28.7 pg (ref 26.0–34.0)
MCHC: 31 g/dL (ref 30.0–36.0)
MCV: 92.5 fL (ref 80.0–100.0)
Platelets: 274 10*3/uL (ref 150–400)
RBC: 4.53 MIL/uL (ref 3.87–5.11)
RDW: 14.7 % (ref 11.5–15.5)
WBC: 12.8 10*3/uL — ABNORMAL HIGH (ref 4.0–10.5)
nRBC: 0 % (ref 0.0–0.2)

## 2019-02-05 LAB — BASIC METABOLIC PANEL
Anion gap: 13 (ref 5–15)
BUN: 16 mg/dL (ref 6–20)
CO2: 22 mmol/L (ref 22–32)
Calcium: 8.8 mg/dL — ABNORMAL LOW (ref 8.9–10.3)
Chloride: 103 mmol/L (ref 98–111)
Creatinine, Ser: 1 mg/dL (ref 0.44–1.00)
GFR calc Af Amer: >60 mL/min (ref >60)
GFR calc non Af Amer: >60 mL/min (ref >60)
Glucose, Bld: 115 mg/dL — ABNORMAL HIGH (ref 70–99)
Potassium: 4.4 mmol/L (ref 3.5–5.1)
Sodium: 138 mmol/L (ref 135–145)

## 2019-02-05 LAB — TROPONIN I (HIGH SENSITIVITY)
Troponin I (High Sensitivity): 5 ng/L (ref <18)
Troponin I (High Sensitivity): 3 ng/L (ref <18)

## 2019-02-05 LAB — BRAIN NATRIURETIC PEPTIDE: B Natriuretic Peptide: 23.6 pg/mL (ref 0.0–100.0)

## 2019-02-05 LAB — D-DIMER, QUANTITATIVE: D-Dimer, Quant: 0.57 ug/mL-FEU — ABNORMAL HIGH (ref 0.00–0.50)

## 2019-02-05 MED ORDER — IOHEXOL 350 MG/ML SOLN
75.0000 mL | Freq: Once | INTRAVENOUS | Status: AC | PRN
  Administered 2019-02-05: 75 mL via INTRAVENOUS

## 2019-02-05 MED ORDER — SODIUM CHLORIDE 0.9 % IV BOLUS (SEPSIS)
1000.0000 mL | Freq: Once | INTRAVENOUS | Status: AC
  Administered 2019-02-05: 1000 mL via INTRAVENOUS

## 2019-02-05 NOTE — ED Notes (Signed)
Patient transported to CT 

## 2019-02-05 NOTE — ED Provider Notes (Signed)
[facility] DEPART[facility] Provider Note   CSN: [id number] Arrival date & time: 02/05/19  1849     History Chief Complaint  Patient presents with  . Dizziness    Renee Walters is a 58 y.o. female.  Patient is a 58 y/o F with PMH WPW disease, s/p pacemaker placement in June of this year presenting to the ER with complaints of hypotensions and presyncope.  Patient reports that yesterday she did a new ab workout at home.  Reports that she is feeling sore in her bilateral ribs which seem to get worse throughout the day and so this evening she took a Flexeril pill.  Reports that about 1 hour after taking the Flexeril she had just sat down and began to feel very lightheaded and dizzy and diaphoretic.  Reports that at that time her blood pressures were in the 123XX123 systolically.  She denies any syncope, shortness of breath or chest pain.  She called EMS and they started IV fluids.  Reports that she is feeling improved and is no longer dizzy or lightheaded right now but that she is still feeling occasional spasms in her lower ribs which feels like a tightening.  Denies any current nausea, chest pain, shortness of breath.  She has taken some of her blood pressure medication today but has not taken her Lasix.  Denies any numbness, tingling, weakness, slurred speech, confusion, facial asymmetry, vision changes.        Past Medical History:  Diagnosis Date  . Allergy   . Asthma   . Family history of early CAD 01/24/2012  . GERD (gastroesophageal reflux disease)    occ  . Hyperlipidemia   . Hypertension   . Leg cramping 03/21/2017  . Neuromuscular disorder (HCC)    sciatica  . Palpitation 11/05/2014  . Sciatica 03/21/2017  . Skin cancer   . WPW (Wolff-Parkinson-White syndrome)    s/p ablation in 1992    Patient Active Problem List   Diagnosis Date Noted  . Fever 03/20/2018  . Viral syndrome 03/20/2018  . Reactive airway disease 03/20/2018  . Plantar fasciitis of  left foot 02/19/2018  . Acute left-sided low back pain without sciatica 02/19/2018  . Leg cramping 03/21/2017  . Sciatica 03/21/2017  . Palpitation 11/05/2014  . Family history of early CAD 01/24/2012  . WPW (Wolff-Parkinson-White syndrome) 01/24/2012  . Hyperlipidemia   . Hypertension     Past Surgical History:  Procedure Laterality Date  . ABDOMINAL HYSTERECTOMY    . CARDIAC CATHETERIZATION    . CARDIAC ELECTROPHYSIOLOGY STUDY AND ABLATION    . CHOLECYSTECTOMY    . COLONOSCOPY    . exploratory GYN surgery    . skin cancer removal     several  . TUBAL LIGATION       OB History   No obstetric history on file.     Family History  Problem Relation Age of Onset  . Arthritis Mother   . COPD Mother   . Diabetes Mother   . Hearing loss Mother   . Hypertension Mother   . Miscarriages / [place] Mother   . Heart disease Father 70       s/p CABG, stents  . Diabetes Father   . Heart attack Father   . Hyperlipidemia Father   . Hypertension Father   . Heart disease Paternal Grandfather   . Yves Dill Parkinson White syndrome Sister   . Cancer Sister   . Diabetes Sister   . Hyperlipidemia Sister   .  Hypertension Sister   . Yves Dill Parkinson White syndrome Other        Sister's daughter  . Hyperlipidemia Brother   . Hypertension Brother   . Hyperlipidemia Brother   . Hypertension Brother   . Lung cancer Maternal Grandfather   . Colon polyps Paternal Grandmother   . Colon cancer Neg Hx   . Esophageal cancer Neg Hx   . Rectal cancer Neg Hx   . Stomach cancer Neg Hx     Social History   Tobacco Use  . Smoking status: Never Smoker  . Smokeless tobacco: Never Used  Substance Use Topics  . Alcohol use: Yes    Comment: occasionally  . Drug use: No    Home Medications Prior to Admission medications   Medication Sig Start Date End Date Taking? Authorizing Provider  Albuterol Sulfate (PROAIR RESPICLICK) 123XX123 (90 Base) MCG/ACT AEPB Inhale 1 puff into the lungs 4 (four)  times daily as needed. 03/20/18   Libby Maw, MD  cetirizine (ZYRTEC) 10 MG tablet Take 10 mg by mouth daily.    [provider]  Cholecalciferol (VITAMIN D3) 50000 units TABS Take 50,000 Units by mouth once a week. 1 tab po weekly for 6 weeks. 03/22/17   Lucille Passy, MD  cyclobenzaprine (FLEXERIL) 5 MG tablet Take 1 tablet (5 mg total) by mouth 3 (three) times daily as needed for muscle spasms. 03/21/17   Lucille Passy, MD  furosemide (LASIX) 20 MG tablet Take 1 tablet (20 mg total) by mouth daily. 10/25/17 01/23/18  Minus Breeding, MD  ibuprofen (ADVIL,MOTRIN) 200 MG tablet Take 600 mg by mouth every 6 (six) hours as needed.    [provider]  metoprolol succinate (TOPROL-XL) 100 MG 24 hr tablet TAKE 1 TABLET DAILY WITH OR IMMEDIATELY FOLLOWING A MEAL 01/30/19   Minus Breeding, MD  metoprolol tartrate (LOPRESSOR) 25 MG tablet Take 1 tablet (25 mg total) by mouth daily at 3 pm. 09/18/17 12/17/17  Minus Breeding, MD  sacubitril-valsartan (ENTRESTO) 49-51 MG Take 1 tablet by mouth 2 (two) times daily. 10/09/17   Minus Breeding, MD  simvastatin (ZOCOR) 20 MG tablet Take 1 tablet (20 mg total) by mouth at bedtime. 02/19/18   Lucille Passy, MD  spironolactone (ALDACTONE) 25 MG tablet Take 1 tablet (25 mg total) by mouth daily. 10/25/17 01/23/18  Minus Breeding, MD    Allergies    Codeine, Other, and Penicillins  Review of Systems   Review of Systems  Constitutional: Negative for chills, fatigue and fever.  HENT: Negative for congestion and sore throat.   Eyes: Negative for visual disturbance.  Respiratory: Negative for cough, shortness of breath and wheezing.   Gastrointestinal: Positive for nausea. Negative for abdominal distention, abdominal pain and vomiting.  Genitourinary: Negative for dysuria.  Musculoskeletal: Positive for myalgias. Negative for arthralgias and back pain.  Skin: Negative for rash and wound.  Neurological: Positive for dizziness and  light-headedness. Negative for tremors, seizures, syncope, speech difficulty, weakness, numbness and headaches.    Physical Exam Updated Vital Signs BP (!) 122/56   Pulse 73   Temp 97.7 F (36.5 C) (Oral)   Resp 17   Ht 5\' 1"  (1.549 m)   Wt 90.7 kg   SpO2 97%   BMI 37.79 kg/m   Physical Exam Vitals and nursing note reviewed.  Constitutional:      General: She is not in acute distress.    Appearance: Normal appearance. She is obese. She is not ill-appearing, toxic-appearing  or diaphoretic.  HENT:     Head: Normocephalic and atraumatic. No raccoon eyes, Battle's sign, abrasion, contusion, masses or laceration.     Jaw: There is normal jaw occlusion.     Nose: Nose normal.     Mouth/Throat:     Mouth: Mucous membranes are moist.  Eyes:     Conjunctiva/sclera: Conjunctivae normal.  Neck:     Trachea: Trachea normal.  Cardiovascular:     Rate and Rhythm: Normal rate and regular rhythm.     Pulses: Normal pulses.  Pulmonary:     Effort: Pulmonary effort is normal.     Breath sounds: Normal breath sounds.  Chest:       Comments: Patient has diffuse mild TTP along the lower ribcage bilaterally.  Abdominal:     General: Abdomen is flat.     Palpations: Abdomen is soft.     Tenderness: There is no abdominal tenderness.  Musculoskeletal:     Cervical back: Full passive range of motion without pain. No edema. No pain with movement, spinous process tenderness or muscular tenderness.     Thoracic back: Normal.     Lumbar back: Normal.     Right lower leg: Edema present.     Left lower leg: Edema present.  Skin:    General: Skin is warm and dry.     Capillary Refill: Capillary refill takes less than 2 seconds.  Neurological:     General: No focal deficit present.     Mental Status: She is alert and oriented to person, place, and time.     Cranial Nerves: Cranial nerve deficit present.  Psychiatric:        Mood and Affect: Mood normal.     ED Results / Procedures /  Treatments   Labs (all labs ordered are listed, but only abnormal results are displayed) Labs Reviewed  BASIC METABOLIC PANEL - Abnormal; Notable for the following components:      Result Value   Glucose, Bld 115 (*)    Calcium 8.8 (*)    All other components within normal limits  CBC - Abnormal; Notable for the following components:   WBC 12.8 (*)    All other components within normal limits  D-DIMER, QUANTITATIVE (NOT AT [facility]) - Abnormal; Notable for the following components:   D-Dimer, Quant 0.57 (*)    All other components within normal limits  SARS CORONAVIRUS 2 (TAT 6-24 HRS)  BRAIN NATRIURETIC PEPTIDE  URINALYSIS, ROUTINE W REFLEX MICROSCOPIC  TROPONIN I (HIGH SENSITIVITY)  TROPONIN I (HIGH SENSITIVITY)    EKG EKG Interpretation  Date/Time:  Wednesday February 05 2019 20:17:49 EST Ventricular Rate:  89 PR Interval:    QRS Duration: 83 QT Interval:  361 QTC Calculation: 440 R Axis:   7 Text Interpretation: Atrial-sensed ventricular-paced rhythm No further analysis attempted due to paced rhythm Confirmed by Davonna Belling [phone]) on 02/05/2019 9:09:39 PM   Radiology CT Angio Chest PE W and/or Wo Contrast  Result Date: 02/05/2019 CLINICAL DATA:  Short of breath and rib pain EXAM: CT ANGIOGRAPHY CHEST WITH CONTRAST TECHNIQUE: Multidetector CT imaging of the chest was performed using the standard protocol during bolus administration of intravenous contrast. Multiplanar CT image reconstructions and MIPs were obtained to evaluate the vascular anatomy. CONTRAST:  10mL OMNIPAQUE IOHEXOL 350 MG/ML SOLN COMPARISON:  None. FINDINGS: Cardiovascular: No filling defects within the pulmonary arteries arteries to suggest acute pulmonary embolism. Mediastinum/Nodes: No axillary supraclavicular adenopathy. No mediastinal hilar adenopathy. No pericardial effusion. Esophagus normal.  Lungs/Pleura: Mild ground-glass opacities. No infiltrate. No to pulmonary infarction. No suspicious  nodularity. Airways normal Upper Abdomen: Limited view of the abdomen unremarkable. Postcholecystectomy. Low-attenuation liver suggests hepatic steatosis. Musculoskeletal: No rib fracture. No acute chest wall abnormality. LEFT-sided pacemaker Review of the MIP images confirms the above findings. IMPRESSION: 1. No evidence of pulmonary embolism. 2. Mild diffuse ground-glass opacities could represent air trapping/atelectasis. 3. No pulmonary infarction or pulmonary infection. 4. No chest wall abnormality.  LEFT-sided pacer noted. 5. Hepatic steatosis Electronically Signed   By: Suzy Bouchard M.D.   On: 02/05/2019 21:44   DG Chest Port 1 View  Result Date: 02/05/2019 CLINICAL DATA:  58 year old female with dizziness. EXAM: PORTABLE CHEST 1 VIEW COMPARISON:  Cardiac CT dated 09/04/2017. FINDINGS: Left lung base density with silhouetting of the left hemidiaphragm may represent atelectasis or infiltrate. A small left pleural effusion may be present. The right lung is clear. No pneumothorax. Top-normal cardiac size. Left pectoral AICD device. No acute osseous pathology. IMPRESSION: 1. Left lung base atelectasis or infiltrate. Small left pleural effusion may be present. 2. No pneumothorax. Electronically Signed   By: Anner Crete M.D.   On: 02/05/2019 19:58    Procedures Procedures (including critical care time)  Medications Ordered in ED Medications  sodium chloride 0.9 % bolus 1,000 mL (0 mLs Intravenous Stopped 02/05/19 2227)  iohexol (OMNIPAQUE) 350 MG/ML injection 75 mL (75 mLs Intravenous Contrast Given 02/05/19 2112)    ED Course  I have reviewed the triage vital signs and the nursing notes.  Pertinent labs & imaging results that were available during my care of the patient were reviewed by me and considered in my medical decision making (see chart for details).  Clinical Course as of Feb 05 2235  Wed Feb 05, 2019  2213 Patient presenting with dizziness and hypotension after taking  flexeril this evening. Currently has been improving with fluids and rest in the ED. Workup reassuring including CTA, EKG. Troponin x2. Patient in agreement with discharge. Her pacemaker was interrogated    [KM]    Clinical Course User Index [KM] Kristine Royal   MDM Rules/Calculators/A&P                      Based on review of vitals, medical screening exam, lab work and/or imaging, there does not appear to be an acute, emergent etiology for the patient's symptoms. Counseled pt on good return precautions and encouraged both PCP and ED follow-up as needed.  Prior to discharge, I also discussed incidental imaging findings with patient in detail and advised appropriate, recommended follow-up in detail.  Clinical Impression: 1. Near syncope   2. Hypotension, unspecified hypotension type     Disposition: Discharge  Prior to providing a prescription for a controlled substance, I independently reviewed the patient's recent prescription history on the [place]. The patient had no recent or regular prescriptions and was deemed appropriate for a brief, less than 3 day prescription of narcotic for acute analgesia.  This note was prepared with assistance of Systems analyst. Occasional wrong-word or sound-a-like substitutions may have occurred due to the inherent limitations of voice recognition software.  Final Clinical Impression(s) / ED Diagnoses Final diagnoses:  Near syncope  Hypotension, unspecified hypotension type    Rx / DC Orders ED Discharge Orders    None       Kristine Royal 02/05/19 2236    Davonna Belling, MD 02/05/19 2257

## 2019-02-05 NOTE — ED Triage Notes (Signed)
Pt arrived via [facility] from home for c/o of dizziness and light headedness after taking flexeril due to being sore from working out the day before. When EMS arrived pt was pale, diaphretic and hypertensive. Pt also states she saw bright red blood when when wiped her butt after having a BM, bust does have hx of hemorrhoids. EMS gave NS 300-350mL. Pt is A&Ox4. Pt is NS and V-paced on monitor.

## 2019-02-05 NOTE — Discharge Instructions (Signed)
You are seen today for an episode of nearly passing out and low blood pressure.  Your work-up was reassuring including your heart enzymes, EKG, electrolytes and CT scan.  Your blood pressure was on the low to normal side so we gave you fluids and your blood pressure improved.  We believe that most of this is probably from some slight dehydration and possibly from taking the Flexeril as well.  We have tested you for COVID-19 to be complete.  Your results should return in about 24 hours.  Please be on the safe side and stay home and quarantine yourself until you know your results.  Drink plenty of water. Thank you for allowing me to care for you today. Please return to the emergency department if you have new or worsening symptoms.

## 2019-02-06 LAB — SARS CORONAVIRUS 2 (TAT 6-24 HRS): SARS Coronavirus 2: NEGATIVE

## 2019-03-19 ENCOUNTER — Telehealth: Payer: Self-pay | Admitting: Family Medicine

## 2019-03-19 ENCOUNTER — Other Ambulatory Visit: Payer: Self-pay | Admitting: Family Medicine

## 2019-03-19 NOTE — Telephone Encounter (Signed)
Patient is calling and requesting a TOC from Dr. Deborra Medina to Dr. Ethelene Hal due to Dr. Deborra Medina leaving the practice. Pls advise. CB is [phone]

## 2019-03-20 ENCOUNTER — Other Ambulatory Visit: Payer: Self-pay | Admitting: Family Medicine

## 2019-03-20 DIAGNOSIS — Z1231 Encounter for screening mammogram for malignant neoplasm of breast: Secondary | ICD-10-CM

## 2019-03-20 NOTE — Telephone Encounter (Signed)
Yes,that would be okay.

## 2019-03-21 NOTE — Telephone Encounter (Signed)
Pt aware Dr. Ethelene Hal okay for [facility], per pt she will give [place] a call when it is time for her to come in for her CPE. No concerns at this time.

## 2019-04-21 ENCOUNTER — Ambulatory Visit
Admission: RE | Admit: 2019-04-21 | Discharge: 2019-04-21 | Disposition: A | Discharge status: Home / Self Care | Payer: Commercial Managed Care - PPO | Source: Ambulatory Visit | Attending: Family Medicine | Admitting: Family Medicine

## 2019-04-21 ENCOUNTER — Other Ambulatory Visit: Payer: Self-pay

## 2019-04-21 DIAGNOSIS — Z1231 Encounter for screening mammogram for malignant neoplasm of breast: Secondary | ICD-10-CM

## 2019-04-27 ENCOUNTER — Other Ambulatory Visit: Payer: Self-pay | Admitting: Cardiology

## 2019-09-16 ENCOUNTER — Encounter: Payer: Self-pay | Admitting: Dermatology

## 2019-09-16 ENCOUNTER — Ambulatory Visit (INDEPENDENT_AMBULATORY_CARE_PROVIDER_SITE_OTHER): Payer: Commercial Managed Care - PPO | Admitting: Dermatology

## 2019-09-16 ENCOUNTER — Other Ambulatory Visit: Payer: Self-pay

## 2019-09-16 DIAGNOSIS — L820 Inflamed seborrheic keratosis: Secondary | ICD-10-CM

## 2019-09-16 DIAGNOSIS — Z1283 Encounter for screening for malignant neoplasm of skin: Secondary | ICD-10-CM

## 2019-09-16 DIAGNOSIS — D225 Melanocytic nevi of trunk: Secondary | ICD-10-CM

## 2019-09-16 DIAGNOSIS — D229 Melanocytic nevi, unspecified: Secondary | ICD-10-CM

## 2019-09-16 NOTE — Progress Notes (Signed)
LN2 RIGHT BREAST X1 RIGHT FOREHEAD X1 SHOULDER X1

## 2019-10-26 ENCOUNTER — Encounter: Payer: Self-pay | Admitting: Dermatology

## 2019-10-26 NOTE — Progress Notes (Addendum)
   Follow-Up Visit   Subjective  Renee Walters is a 59 y.o. female who presents for the following: Annual Exam (right breast, right forehead, right shoulder new spots, FYI but concerned insurance won't cover elective spots).  New growths Location: Right shoulder, right forehead, right breast. Duration:  Quality: Enlarged Associated Signs/Symptoms: Itching Modifying Factors:  Severity:  Timing: Context:   Objective  Well appearing patient in no apparent distress; mood and affect are within normal limits.  All sun exposed areas plus back examined. Plus chest.   Assessment & Plan    Inflamed seborrheic keratosis (3) Right Shoulder - Posterior; Right Axilla; Right Forehead  After patient consent, each lesion sprayed with five 1 second cycles of liquid nitrogen.  Knows the risk of treatment failure relieving discoloration/scarring.  To call with status report in 4 weeks.  Nevus Mid Back  Annual skin examination     I, Lavonna Monarch, MD, have reviewed all documentation for this visit.  The documentation on 10/30/19 for the exam, diagnosis, procedures, and orders are all accurate and complete.

## 2020-04-21 ENCOUNTER — Ambulatory Visit (INDEPENDENT_AMBULATORY_CARE_PROVIDER_SITE_OTHER): Payer: Commercial Managed Care - PPO | Admitting: Family Medicine

## 2020-04-21 ENCOUNTER — Encounter: Payer: Self-pay | Admitting: Family Medicine

## 2020-04-21 ENCOUNTER — Other Ambulatory Visit: Payer: Self-pay

## 2020-04-21 VITALS — BP 128/82 | Temp 96.1°F | Ht 61.0 in | Wt 183.8 lb

## 2020-04-21 DIAGNOSIS — L03012 Cellulitis of left finger: Secondary | ICD-10-CM | POA: Diagnosis not present

## 2020-04-21 MED ORDER — CEPHALEXIN 500 MG PO CAPS: 500.0000 mg | ORAL_CAPSULE | Freq: Two times a day (BID) | ORAL | 0 refills | Status: AC

## 2020-04-21 NOTE — Progress Notes (Signed)
Renee Walters is a 59 y.o. female  Chief Complaint  Patient presents with  . Acute Visit    Infected finger, lt hand, ring finger x 2 weeks    HPI: Renee Walters is a 60 y.o. female patient of Dr. Ethelene Hal who complains of infection on her Lt hand 4th finger x 1.5-2wks tenderness and area of involvement is improving overall. No fever, chills. No drainage.    Past Medical History:  Diagnosis Date  . Allergy   . Asthma   . Atypical mole 09/10/2002   outer right knee marked (tx exc)  . Atypical mole 09/09/2012   LEFT UPPER NOSE MILD  . Atypical nevi 03/11/2003   LUQ, SUP. SLIGHT/ MOD.  Marland Kitchen Atypical nevi 03/11/2003   LUQ INF. SLIGHT [facility])  . Atypical nevi 07/25/2006   RIGHT CHEST SLIGHT   . Basal cell carcinoma 01/13/2010   INNER LEFT EAR TX MOHS  . Family history of early CAD 01/24/2012  . GERD (gastroesophageal reflux disease)    occ  . Hyperlipidemia   . Hypertension   . Leg cramping 03/21/2017  . Melanoma ([place]) 05/22/2002   right upper outer claf (tx excision)  . Neuromuscular disorder (HCC)    sciatica  . Palpitation 11/05/2014  . Sciatica 03/21/2017  . Skin cancer   . WPW (Wolff-Parkinson-White syndrome)    s/p ablation in 1992    Past Surgical History:  Procedure Laterality Date  . ABDOMINAL HYSTERECTOMY    . CARDIAC CATHETERIZATION    . CARDIAC ELECTROPHYSIOLOGY STUDY AND ABLATION    . CHOLECYSTECTOMY    . COLONOSCOPY    . exploratory GYN surgery    . pace maker  07/2018  . skin cancer removal     several  . TUBAL LIGATION      Social History   Socioeconomic History  . Marital status: Married    Spouse name: Not on file  . Number of children: 2  . Years of education: Not on file  . Highest education level: Not on file  Occupational History    Employer: [employer]  Tobacco Use  . Smoking status: Never Smoker  . Smokeless tobacco: Never Used  Vaping Use  . Vaping Use: Never used  Substance and Sexual Activity  . Alcohol use: Yes     Comment: occasionally  . Drug use: No  . Sexual activity: Not on file  Other Topics Concern  . Not on file  Social History Narrative  . Not on file   Social Determinants of Health   Financial Resource Strain: Not on file  Food Insecurity: Not on file  Transportation Needs: Not on file  Physical Activity: Not on file  Stress: Not on file  Social Connections: Not on file  Intimate Partner Violence: Not on file    Family History  Problem Relation Age of Onset  . Arthritis Mother   . COPD Mother   . Diabetes Mother   . Hearing loss Mother   . Hypertension Mother   . Miscarriages / [place] Mother   . Heart disease Father 82       s/p CABG, stents  . Diabetes Father   . Heart attack Father   . Hyperlipidemia Father   . Hypertension Father   . Heart disease Paternal Grandfather   . Yves Dill Parkinson White syndrome Sister   . Cancer Sister   . Diabetes Sister   . Hyperlipidemia Sister   . Hypertension Sister   . Yves Dill  Parkinson White syndrome Other        Sister's daughter  . Hyperlipidemia Brother   . Hypertension Brother   . Hyperlipidemia Brother   . Hypertension Brother   . Lung cancer Maternal Grandfather   . Colon polyps Paternal Grandmother   . Colon cancer Neg Hx   . Esophageal cancer Neg Hx   . Rectal cancer Neg Hx   . Stomach cancer Neg Hx      Immunization History  Administered Date(s) Administered  . Influenza-Unspecified 11/20/2016, 11/21/2017  . Pneumococcal Polysaccharide-23 04/10/2018  . Tdap 03/21/2017    Outpatient Encounter Medications as of 04/21/2020  Medication Sig  . Albuterol Sulfate (PROAIR RESPICLICK) 604 (90 Base) MCG/ACT AEPB Inhale 1 puff into the lungs 4 (four) times daily as needed.  . cetirizine (ZYRTEC) 10 MG tablet Take 10 mg by mouth daily.  . Cholecalciferol (VITAMIN D3) 50000 units TABS Take 50,000 Units by mouth once a week. 1 tab po weekly for 6 weeks.  . cyclobenzaprine (FLEXERIL) 5 MG tablet Take 1 tablet (5 mg total)  by mouth 3 (three) times daily as needed for muscle spasms.  . dapagliflozin propanediol (FARXIGA) 10 MG TABS tablet TAKE 1 TABLET BY MOUTH EVERY DAY IN THE MORNING  . ibuprofen (ADVIL,MOTRIN) 200 MG tablet Take 600 mg by mouth every 6 (six) hours as needed.  . metoprolol succinate (TOPROL-XL) 100 MG 24 hr tablet TAKE 1 TABLET DAILY WITH OR IMMEDIATELY FOLLOWING A MEAL  . sacubitril-valsartan (ENTRESTO) 49-51 MG Take 1 tablet by mouth 2 (two) times daily.  . simvastatin (ZOCOR) 20 MG tablet Take 1 tablet (20 mg total) by mouth at bedtime.  . Wound Dressings (TRIPLE HELIX COLLAGEN) POWD Apply topically.  . furosemide (LASIX) 20 MG tablet Take 1 tablet (20 mg total) by mouth daily.  . metoprolol tartrate (LOPRESSOR) 25 MG tablet Take 1 tablet (25 mg total) by mouth daily at 3 pm.  . [DISCONTINUED] spironolactone (ALDACTONE) 25 MG tablet Take 1 tablet (25 mg total) by mouth daily.   Facility-Administered Encounter Medications as of 04/21/2020  Medication  . 0.9 %  sodium chloride infusion     ROS: Pertinent positives and negatives noted in HPI. Remainder of ROS non-contributory  Allergies  Allergen Reactions  . Codeine Nausea Only  . Other     Vicryl sutures blisters at site   . Penicillins Other (See Comments)    Passed out as a child    BP 128/82 (BP Location: Left Arm, Patient Position: Sitting, Cuff Size: Normal)   Temp (!) 96.1 F (35.6 C) (Temporal)   Ht 5\' 1"  (1.549 m)   Wt 183 lb 12.8 oz (83.4 kg)   BMI 34.73 kg/m   Physical Exam Constitutional:      General: She is not in acute distress.    Appearance: Normal appearance. She is not ill-appearing.  Musculoskeletal:     Left hand: Swelling present.     Comments: Lt 4th finger lateral aspect of nail bed with mild edema and moderate erythema, no drainage, mild TTP; FROM  Neurological:     Mental Status: She is alert and oriented to person, place, and time.  Psychiatric:        Behavior: Behavior normal.      A/P:   1. Paronychia of finger of left hand - 4th finger on Lt hand - warm water soaks at least BID Rx: - cephALEXin (KEFLEX) 500 MG capsule; Take 1 capsule (500 mg total) by mouth 2 (two) times  daily for 10 days.  Dispense: 20 capsule; Refill: 0 - f/u if symptoms worsen or do not improve in 7-10 days. Would likely need I&D at that time Discussed plan and reviewed medications with patient, including risks, benefits, and potential side effects. Pt expressed understand. All questions answered.      This visit occurred during the SARS-CoV-2 public health emergency.  Safety protocols were in place, including screening questions prior to the visit, additional usage of staff PPE, and extensive cleaning of exam room while observing appropriate contact time as indicated for disinfecting solutions.

## 2020-04-23 ENCOUNTER — Ambulatory Visit: Payer: Commercial Managed Care - PPO | Admitting: Family Medicine

## 2020-05-06 ENCOUNTER — Ambulatory Visit: Payer: Commercial Managed Care - PPO | Admitting: Nurse Practitioner

## 2020-05-14 ENCOUNTER — Encounter: Payer: Self-pay | Admitting: Family Medicine

## 2020-05-14 ENCOUNTER — Other Ambulatory Visit: Payer: Self-pay

## 2020-05-14 ENCOUNTER — Ambulatory Visit (INDEPENDENT_AMBULATORY_CARE_PROVIDER_SITE_OTHER): Payer: Commercial Managed Care - PPO | Admitting: Family Medicine

## 2020-05-14 VITALS — BP 122/70 | HR 72 | Temp 97.6°F | Ht 61.5 in | Wt 183.4 lb

## 2020-05-14 DIAGNOSIS — E559 Vitamin D deficiency, unspecified: Secondary | ICD-10-CM | POA: Diagnosis not present

## 2020-05-14 DIAGNOSIS — Z0000 Encounter for general adult medical examination without abnormal findings: Secondary | ICD-10-CM

## 2020-05-14 NOTE — Patient Instructions (Signed)
Health Maintenance for Postmenopausal Women Menopause is a normal process in which your ability to get pregnant comes to an end. This process happens slowly over many months or years, usually between the ages of 48 and 55. Menopause is complete when you have missed your menstrual periods for 12 months. It is important to talk with your health care provider about some of the most common conditions that affect women after menopause (postmenopausal women). These include heart disease, cancer, and bone loss (osteoporosis). Adopting a healthy lifestyle and getting preventive care can help to promote your health and wellness. The actions you take can also lower your chances of developing some of these common conditions. What should I know about menopause? During menopause, you may get a number of symptoms, such as:  Hot flashes. These can be moderate or severe.  Night sweats.  Decrease in sex drive.  Mood swings.  Headaches.  Tiredness.  Irritability.  Memory problems.  Insomnia. Choosing to treat or not to treat these symptoms is a decision that you make with your health care provider. Do I need hormone replacement therapy?  Hormone replacement therapy is effective in treating symptoms that are caused by menopause, such as hot flashes and night sweats.  Hormone replacement carries certain risks, especially as you become older. If you are thinking about using estrogen or estrogen with progestin, discuss the benefits and risks with your health care provider. What is my risk for heart disease and stroke? The risk of heart disease, heart attack, and stroke increases as you age. One of the causes may be a change in the body's hormones during menopause. This can affect how your body uses dietary fats, triglycerides, and cholesterol. Heart attack and stroke are medical emergencies. There are many things that you can do to help prevent heart disease and stroke. Watch your blood pressure  High  blood pressure causes heart disease and increases the risk of stroke. This is more likely to develop in people who have high blood pressure readings, are of African descent, or are overweight.  Have your blood pressure checked: ? Every 3-5 years if you are 18-39 years of age. ? Every year if you are 40 years old or older. Eat a healthy diet  Eat a diet that includes plenty of vegetables, fruits, low-fat dairy products, and lean protein.  Do not eat a lot of foods that are high in solid fats, added sugars, or sodium.   Get regular exercise Get regular exercise. This is one of the most important things you can do for your health. Most adults should:  Try to exercise for at least 150 minutes each week. The exercise should increase your heart rate and make you sweat (moderate-intensity exercise).  Try to do strengthening exercises at least twice each week. Do these in addition to the moderate-intensity exercise.  Spend less time sitting. Even light physical activity can be beneficial. Other tips  Work with your health care provider to achieve or maintain a healthy weight.  Do not use any products that contain nicotine or tobacco, such as cigarettes, e-cigarettes, and chewing tobacco. If you need help quitting, ask your health care provider.  Know your numbers. Ask your health care provider to check your cholesterol and your blood sugar (glucose). Continue to have your blood tested as directed by your health care provider. Do I need screening for cancer? Depending on your health history and family history, you may need to have cancer screening at different stages of your life.   This may include screening for:  Breast cancer.  Cervical cancer.  Lung cancer.  Colorectal cancer. What is my risk for osteoporosis? After menopause, you may be at increased risk for osteoporosis. Osteoporosis is a condition in which bone destruction happens more quickly than new bone creation. To help prevent  osteoporosis or the bone fractures that can happen because of osteoporosis, you may take the following actions:  If you are 63-45 years old, get at least 1,000 mg of calcium and at least 600 mg of vitamin D per day.  If you are older than age 20 but younger than age 67, get at least 1,200 mg of calcium and at least 600 mg of vitamin D per day.  If you are older than age 49, get at least 1,200 mg of calcium and at least 800 mg of vitamin D per day. Smoking and drinking excessive alcohol increase the risk of osteoporosis. Eat foods that are rich in calcium and vitamin D, and do weight-bearing exercises several times each week as directed by your health care provider. How does menopause affect my mental health? Depression may occur at any age, but it is more common as you become older. Common symptoms of depression include:  Low or sad mood.  Changes in sleep patterns.  Changes in appetite or eating patterns.  Feeling an overall lack of motivation or enjoyment of activities that you previously enjoyed.  Frequent crying spells. Talk with your health care provider if you think that you are experiencing depression. General instructions See your health care provider for regular wellness exams and vaccines. This may include:  Scheduling regular health, dental, and eye exams.  Getting and maintaining your vaccines. These include: ? Influenza vaccine. Get this vaccine each year before the flu season begins. ? Pneumonia vaccine. ? Shingles vaccine. ? Tetanus, diphtheria, and pertussis (Tdap) booster vaccine. Your health care provider may also recommend other immunizations. Tell your health care provider if you have ever been abused or do not feel safe at home. Summary  Menopause is a normal process in which your ability to get pregnant comes to an end.  This condition causes hot flashes, night sweats, decreased interest in sex, mood swings, headaches, or lack of sleep.  Treatment for this  condition may include hormone replacement therapy.  Take actions to keep yourself healthy, including exercising regularly, eating a healthy diet, watching your weight, and checking your blood pressure and blood sugar levels.  Get screened for cancer and depression. Make sure that you are up to date with all your vaccines. This information is not intended to replace advice given to you by your health care provider. Make sure you discuss any questions you have with your health care provider. Document Revised: 01/23/2018 Document Reviewed: 01/23/2018 Elsevier Patient Education  2021 Elsevier Inc.  Preventive Care 75-94 Years Old, Female Preventive care refers to lifestyle choices and visits with your health care provider that can promote health and wellness. This includes:  A yearly physical exam. This is also called an annual wellness visit.  Regular dental and eye exams.  Immunizations.  Screening for certain conditions.  Healthy lifestyle choices, such as: ? Eating a healthy diet. ? Getting regular exercise. ? Not using drugs or products that contain nicotine and tobacco. ? Limiting alcohol use. What can I expect for my preventive care visit? Physical exam Your health care provider will check your:  Height and weight. These may be used to calculate your BMI (body mass index). BMI is  a measurement that tells if you are at a healthy weight.  Heart rate and blood pressure.  Body temperature.  Skin for abnormal spots. Counseling Your health care provider may ask you questions about your:  Past medical problems.  Family's medical history.  Alcohol, tobacco, and drug use.  Emotional well-being.  Home life and relationship well-being.  Sexual activity.  Diet, exercise, and sleep habits.  Work and work Statistician.  Access to firearms.  Method of birth control.  Menstrual cycle.  Pregnancy history. What immunizations do I need? Vaccines are usually given at  various ages, according to a schedule. Your health care provider will recommend vaccines for you based on your age, medical history, and lifestyle or other factors, such as travel or where you work.   What tests do I need? Blood tests  Lipid and cholesterol levels. These may be checked every 5 years, or more often if you are over 81 years old.  Hepatitis C test.  Hepatitis B test. Screening  Lung cancer screening. You may have this screening every year starting at age 69 if you have a 30-pack-year history of smoking and currently smoke or have quit within the past 15 years.  Colorectal cancer screening. ? All adults should have this screening starting at age 69 and continuing until age 44. ? Your health care provider may recommend screening at age 32 if you are at increased risk. ? You will have tests every 1-10 years, depending on your results and the type of screening test.  Diabetes screening. ? This is done by checking your blood sugar (glucose) after you have not eaten for a while (fasting). ? You may have this done every 1-3 years.  Mammogram. ? This may be done every 1-2 years. ? Talk with your health care provider about when you should start having regular mammograms. This may depend on whether you have a family history of breast cancer.  BRCA-related cancer screening. This may be done if you have a family history of breast, ovarian, tubal, or peritoneal cancers.  Pelvic exam and Pap test. ? This may be done every 3 years starting at age 65. ? Starting at age 24, this may be done every 5 years if you have a Pap test in combination with an HPV test. Other tests  STD (sexually transmitted disease) testing, if you are at risk.  Bone density scan. This is done to screen for osteoporosis. You may have this scan if you are at high risk for osteoporosis. Talk with your health care provider about your test results, treatment options, and if necessary, the need for more  tests. Follow these instructions at home: Eating and drinking  Eat a diet that includes fresh fruits and vegetables, whole grains, lean protein, and low-fat dairy products.  Take vitamin and mineral supplements as recommended by your health care provider.  Do not drink alcohol if: ? Your health care provider tells you not to drink. ? You are pregnant, may be pregnant, or are planning to become pregnant.  If you drink alcohol: ? Limit how much you have to 0-1 drink a day. ? Be aware of how much alcohol is in your drink. In the [place] one drink equals one 12 oz bottle of beer (355 mL), one 5 oz glass of wine (148 mL), or one 1 oz glass of hard liquor (44 mL).   Lifestyle  Take daily care of your teeth and gums. Brush your teeth every morning and night with fluoride toothpaste.  Floss one time each day.  Stay active. Exercise for at least 30 minutes 5 or more days each week.  Do not use any products that contain nicotine or tobacco, such as cigarettes, e-cigarettes, and chewing tobacco. If you need help quitting, ask your health care provider.  Do not use drugs.  If you are sexually active, practice safe sex. Use a condom or other form of protection to prevent STIs (sexually transmitted infections).  If you do not wish to become pregnant, use a form of birth control. If you plan to become pregnant, see your health care provider for a prepregnancy visit.  If told by your health care provider, take low-dose aspirin daily starting at age 37.  Find healthy ways to cope with stress, such as: ? Meditation, yoga, or listening to music. ? Journaling. ? Talking to a trusted person. ? Spending time with friends and family. Safety  Always wear your seat belt while driving or riding in a vehicle.  Do not drive: ? If you have been drinking alcohol. Do not ride with someone who has been drinking. ? When you are tired or distracted. ? While texting.  Wear a helmet and other protective  equipment during sports activities.  If you have firearms in your house, make sure you follow all gun safety procedures. What's next?  Visit your health care provider once a year for an annual wellness visit.  Ask your health care provider how often you should have your eyes and teeth checked.  Stay up to date on all vaccines. This information is not intended to replace advice given to you by your health care provider. Make sure you discuss any questions you have with your health care provider. Document Revised: 11/04/2019 Document Reviewed: 10/11/2017 Elsevier Patient Education  2021 [employer].

## 2020-05-14 NOTE — Progress Notes (Addendum)
Established Patient Office Visit  Subjective:  Patient ID: Renee Walters, female    DOB: January 10, 1961  Age: 60 y.o. MRN: [id number]  CC:  Chief Complaint  Patient presents with  . Annual Exam    Pt is here for physical, pt is not fasting. No breast or pap     HPI Renee Walters presents for a health check and exam.  She has been doing quite well.  She is followed by [facility] cardiology for WPW and palpitations.  She has an implanted pacemaker.  She Is Status Post corrective CHF.  Female care is through GYN.  Up-to-date on her Pap smear, mammogram and colonoscopy.  She does not smoke or drink alcohol.  Husband has severe carotid blockages and will undergo surgery soon.  Past Medical History:  Diagnosis Date  . Allergy   . Asthma   . Atypical mole 09/10/2002   outer right knee marked (tx exc)  . Atypical mole 09/09/2012   LEFT UPPER NOSE MILD  . Atypical nevi 03/11/2003   LUQ, SUP. SLIGHT/ MOD.  Marland Kitchen Atypical nevi 03/11/2003   LUQ INF. SLIGHT [facility])  . Atypical nevi 07/25/2006   RIGHT CHEST SLIGHT   . Basal cell carcinoma 01/13/2010   INNER LEFT EAR TX MOHS  . Family history of early CAD 01/24/2012  . GERD (gastroesophageal reflux disease)    occ  . Hyperlipidemia   . Hypertension   . Leg cramping 03/21/2017  . Melanoma ([place]) 05/22/2002   right upper outer claf (tx excision)  . Neuromuscular disorder (HCC)    sciatica  . Palpitation 11/05/2014  . Sciatica 03/21/2017  . Skin cancer   . WPW (Wolff-Parkinson-White syndrome)    s/p ablation in 1992    Past Surgical History:  Procedure Laterality Date  . ABDOMINAL HYSTERECTOMY    . CARDIAC CATHETERIZATION    . CARDIAC ELECTROPHYSIOLOGY STUDY AND ABLATION    . CHOLECYSTECTOMY    . COLONOSCOPY    . exploratory GYN surgery    . pace maker  07/2018  . skin cancer removal     several  . TUBAL LIGATION      Family History  Problem Relation Age of Onset  . Arthritis Mother   . COPD Mother   . Diabetes Mother   . Hearing  loss Mother   . Hypertension Mother   . Miscarriages / [place] Mother   . Heart disease Father 53       s/p CABG, stents  . Diabetes Father   . Heart attack Father   . Hyperlipidemia Father   . Hypertension Father   . Heart disease Paternal Grandfather   . Yves Dill Parkinson White syndrome Sister   . Cancer Sister   . Diabetes Sister   . Hyperlipidemia Sister   . Hypertension Sister   . Yves Dill Parkinson White syndrome Other        Sister's daughter  . Hyperlipidemia Brother   . Hypertension Brother   . Hyperlipidemia Brother   . Hypertension Brother   . Lung cancer Maternal Grandfather   . Colon polyps Paternal Grandmother   . Colon cancer Neg Hx   . Esophageal cancer Neg Hx   . Rectal cancer Neg Hx   . Stomach cancer Neg Hx     Social History   Socioeconomic History  . Marital status: Married    Spouse name: Not on file  . Number of children: 2  . Years of education: Not on file  .  Highest education level: Not on file  Occupational History    Employer: [employer]  Tobacco Use  . Smoking status: Never Smoker  . Smokeless tobacco: Never Used  Vaping Use  . Vaping Use: Never used  Substance and Sexual Activity  . Alcohol use: Yes    Comment: occasionally  . Drug use: No  . Sexual activity: Not on file  Other Topics Concern  . Not on file  Social History Narrative  . Not on file   Social Determinants of Health   Financial Resource Strain: Not on file  Food Insecurity: Not on file  Transportation Needs: Not on file  Physical Activity: Not on file  Stress: Not on file  Social Connections: Not on file  Intimate Partner Violence: Not on file    Outpatient Medications Prior to Visit  Medication Sig Dispense Refill  . Albuterol Sulfate (PROAIR RESPICLICK) 505 (90 Base) MCG/ACT AEPB Inhale 1 puff into the lungs 4 (four) times daily as needed. 1 each 1  . calcium-vitamin D (OSCAL WITH D) 500-200 MG-UNIT tablet Take 1 tablet by mouth.    . cetirizine (ZYRTEC)  10 MG tablet Take 10 mg by mouth daily.    . Cholecalciferol (VITAMIN D3) 50000 units TABS Take 50,000 Units by mouth once a week. 1 tab po weekly for 6 weeks. 6 tablet 0  . cyclobenzaprine (FLEXERIL) 5 MG tablet Take 1 tablet (5 mg total) by mouth 3 (three) times daily as needed for muscle spasms. 30 tablet 1  . dapagliflozin propanediol (FARXIGA) 10 MG TABS tablet TAKE 1 TABLET BY MOUTH EVERY DAY IN THE MORNING    . furosemide (LASIX) 20 MG tablet Take 20 mg by mouth.    Marland Kitchen ibuprofen (ADVIL,MOTRIN) 200 MG tablet Take 600 mg by mouth every 6 (six) hours as needed.    . metoprolol succinate (TOPROL-XL) 100 MG 24 hr tablet TAKE 1 TABLET DAILY WITH OR IMMEDIATELY FOLLOWING A MEAL 90 tablet 0  . Omega-3 Fatty Acids (FISH OIL) 1000 MG CAPS Take 2,000 mg by mouth.    . sacubitril-valsartan (ENTRESTO) 49-51 MG Take 1 tablet by mouth 2 (two) times daily. 60 tablet 11  . simvastatin (ZOCOR) 20 MG tablet Take 1 tablet (20 mg total) by mouth at bedtime. 90 tablet 3  . Wound Dressings (TRIPLE HELIX COLLAGEN) POWD Apply topically.    . metoprolol tartrate (LOPRESSOR) 25 MG tablet Take 1 tablet (25 mg total) by mouth daily at 3 pm. 180 tablet 3   Facility-Administered Medications Prior to Visit  Medication Dose Route Frequency Provider Last Rate Last Admin  . 0.9 %  sodium chloride infusion  500 mL Intravenous Once Milus Banister, MD        Allergies  Allergen Reactions  . Codeine Nausea Only  . Other     Vicryl sutures blisters at site   . Penicillins Other (See Comments)    Passed out as a child  . Wound Dressing Adhesive Rash    When leaving Band-Aid on for long periods of time (latex)     ROS Review of Systems  Constitutional: Negative.   HENT: Negative.   Eyes: Negative for photophobia and visual disturbance.  Respiratory: Negative.   Cardiovascular: Negative.   Gastrointestinal: Negative.   Endocrine: Negative for polyphagia and polyuria.  Genitourinary: Negative.   Musculoskeletal:  Negative for gait problem and joint swelling.  Neurological: Negative.   Psychiatric/Behavioral: Negative.       Objective:    Physical Exam Nursing  note reviewed.  Constitutional:      General: She is not in acute distress.    Appearance: Normal appearance. She is not ill-appearing, toxic-appearing or diaphoretic.  HENT:     Head: Normocephalic and atraumatic.     Right Ear: Tympanic membrane, ear canal and external ear normal.     Left Ear: Tympanic membrane, ear canal and external ear normal.     Mouth/Throat:     Mouth: Mucous membranes are moist.     Pharynx: Oropharynx is clear. No oropharyngeal exudate or posterior oropharyngeal erythema.  Eyes:     General: No scleral icterus.    Extraocular Movements: Extraocular movements intact.     Conjunctiva/sclera: Conjunctivae normal.     Pupils: Pupils are equal, round, and reactive to light.  Neck:     Vascular: No carotid bruit.  Cardiovascular:     Rate and Rhythm: Normal rate and regular rhythm.     Pulses:          Carotid pulses are 2+ on the right side and 2+ on the left side. Pulmonary:     Effort: Pulmonary effort is normal.     Breath sounds: Normal breath sounds.  Abdominal:     General: Bowel sounds are normal.  Musculoskeletal:     Cervical back: No rigidity or tenderness.  Lymphadenopathy:     Cervical: No cervical adenopathy.  Skin:    General: Skin is warm and dry.  Neurological:     Mental Status: She is alert and oriented to person, place, and time.  Psychiatric:        Mood and Affect: Mood normal.        Behavior: Behavior normal.     BP 122/70 (BP Location: Left Arm, Patient Position: Sitting, Cuff Size: Normal)   Pulse 72   Temp 97.6 F (36.4 C) (Temporal)   Ht 5' 1.5" (1.562 m)   Wt 183 lb 6.4 oz (83.2 kg)   SpO2 97%   BMI 34.09 kg/m  Wt Readings from Last 3 Encounters:  05/14/20 183 lb 6.4 oz (83.2 kg)  04/21/20 183 lb 12.8 oz (83.4 kg)  02/05/19 200 lb (90.7 kg)     Health  Maintenance Due  Topic Date Due  . Hepatitis C Screening  Never done  . HIV Screening  Never done    There are no preventive care reminders to display for this patient.  Lab Results  Component Value Date   TSH 3.32 03/21/2017   Lab Results  Component Value Date   WBC 12.8 (H) 02/05/2019   HGB 13.0 02/05/2019   HCT 41.9 02/05/2019   MCV 92.5 02/05/2019   PLT 274 02/05/2019   Lab Results  Component Value Date   NA 138 02/05/2019   K 4.4 02/05/2019   CO2 22 02/05/2019   GLUCOSE 115 (H) 02/05/2019   BUN 16 02/05/2019   CREATININE 1.00 02/05/2019   BILITOT 0.6 03/21/2017   ALKPHOS 80 03/21/2017   AST 16 03/21/2017   ALT 20 03/21/2017   PROT 6.9 03/21/2017   ALBUMIN 4.4 03/21/2017   CALCIUM 8.8 (L) 02/05/2019   ANIONGAP 13 02/05/2019   GFR 98.29 03/21/2017   Lab Results  Component Value Date   CHOL 270 (H) 01/02/2017   Lab Results  Component Value Date   HDL 67.60 01/02/2017   Lab Results  Component Value Date   LDLCALC 178 (H) 11/16/2014   Lab Results  Component Value Date   TRIG 240.0 (H) 01/02/2017  Lab Results  Component Value Date   CHOLHDL 4 01/02/2017   No results found for: HGBA1C    Assessment & Plan:   Problem List Items Addressed This Visit      Other   Healthcare maintenance - Primary   Relevant Orders   CBC   Comprehensive metabolic panel   Lipid panel   Urinalysis, Routine w reflex microscopic   Vitamin D deficiency   Relevant Orders   VITAMIN D 25 Hydroxy (Vit-D Deficiency, Fractures)      No orders of the defined types were placed in this encounter.   Follow-up: Return in about 1 year (around 05/14/2021), or if symptoms worsen or fail to improve.  Given information on health maintenance and disease prevention.  She will continue follow-up with [facility]ology and see me on an as needed basis otherwise return in 1 year.   Libby Maw, MD

## 2020-05-20 ENCOUNTER — Other Ambulatory Visit (INDEPENDENT_AMBULATORY_CARE_PROVIDER_SITE_OTHER): Payer: Commercial Managed Care - PPO

## 2020-05-20 ENCOUNTER — Other Ambulatory Visit: Payer: Self-pay

## 2020-05-20 ENCOUNTER — Encounter: Payer: Self-pay | Admitting: Family Medicine

## 2020-05-20 DIAGNOSIS — E559 Vitamin D deficiency, unspecified: Secondary | ICD-10-CM

## 2020-05-20 DIAGNOSIS — Z0000 Encounter for general adult medical examination without abnormal findings: Secondary | ICD-10-CM

## 2020-05-20 LAB — URINALYSIS, ROUTINE W REFLEX MICROSCOPIC
Bilirubin Urine: NEGATIVE
Hgb urine dipstick: NEGATIVE
Ketones, ur: NEGATIVE
Leukocytes,Ua: NEGATIVE
Nitrite: NEGATIVE
RBC / HPF: NONE SEEN (ref 0–?)
Specific Gravity, Urine: <=1.005 — AB (ref 1.000–1.030)
Total Protein, Urine: NEGATIVE
Urine Glucose: >=1000 — AB
Urobilinogen, UA: 0.2 (ref 0.0–1.0)
pH: 6.5 (ref 5.0–8.0)

## 2020-05-20 LAB — COMPREHENSIVE METABOLIC PANEL
ALT: 16 U/L (ref 0–35)
AST: 13 U/L (ref 0–37)
Albumin: 4.3 g/dL (ref 3.5–5.2)
Alkaline Phosphatase: 70 U/L (ref 39–117)
BUN: 13 mg/dL (ref 6–23)
CO2: 30 mEq/L (ref 19–32)
Calcium: 9.3 mg/dL (ref 8.4–10.5)
Chloride: 104 mEq/L (ref 96–112)
Creatinine, Ser: 0.66 mg/dL (ref 0.40–1.20)
GFR: 95.85 mL/min (ref >60.00)
Glucose, Bld: 92 mg/dL (ref 70–99)
Potassium: 5.3 mEq/L — ABNORMAL HIGH (ref 3.5–5.1)
Sodium: 140 mEq/L (ref 135–145)
Total Bilirubin: 0.4 mg/dL (ref 0.2–1.2)
Total Protein: 6.6 g/dL (ref 6.0–8.3)

## 2020-05-20 LAB — CBC
HCT: 42.4 % (ref 36.0–46.0)
Hemoglobin: 13.7 g/dL (ref 12.0–15.0)
MCHC: 32.4 g/dL (ref 30.0–36.0)
MCV: 88.5 fl (ref 78.0–100.0)
Platelets: 239 10*3/uL (ref 150.0–400.0)
RBC: 4.79 Mil/uL (ref 3.87–5.11)
RDW: 14.6 % (ref 11.5–15.5)
WBC: 6.2 10*3/uL (ref 4.0–10.5)

## 2020-05-20 LAB — LIPID PANEL
Cholesterol: 183 mg/dL (ref 0–200)
HDL: 63.7 mg/dL (ref >39.00)
LDL Cholesterol: 93 mg/dL (ref 0–99)
NonHDL: 118.95
Total CHOL/HDL Ratio: 3
Triglycerides: 132 mg/dL (ref 0.0–149.0)
VLDL: 26.4 mg/dL (ref 0.0–40.0)

## 2020-05-20 LAB — VITAMIN D 25 HYDROXY (VIT D DEFICIENCY, FRACTURES): VITD: 29.15 ng/mL — ABNORMAL LOW (ref 30.00–100.00)

## 2020-05-20 NOTE — Progress Notes (Signed)
Per orders of Dr. Ethelene Hal pt is here for lab draw pt tolerated draw well

## 2020-05-31 ENCOUNTER — Other Ambulatory Visit: Payer: Self-pay | Admitting: Family Medicine

## 2020-05-31 DIAGNOSIS — Z1231 Encounter for screening mammogram for malignant neoplasm of breast: Secondary | ICD-10-CM

## 2020-07-20 ENCOUNTER — Other Ambulatory Visit: Payer: Self-pay

## 2020-07-20 ENCOUNTER — Ambulatory Visit
Admission: RE | Admit: 2020-07-20 | Discharge: 2020-07-20 | Disposition: A | Discharge status: Home / Self Care | Payer: Commercial Managed Care - PPO | Source: Ambulatory Visit | Attending: Family Medicine | Admitting: Family Medicine

## 2020-07-20 DIAGNOSIS — Z1231 Encounter for screening mammogram for malignant neoplasm of breast: Secondary | ICD-10-CM

## 2020-08-06 ENCOUNTER — Encounter: Payer: Self-pay | Admitting: Family Medicine

## 2020-09-15 ENCOUNTER — Other Ambulatory Visit: Payer: Self-pay

## 2020-09-15 ENCOUNTER — Ambulatory Visit (INDEPENDENT_AMBULATORY_CARE_PROVIDER_SITE_OTHER): Payer: Commercial Managed Care - PPO | Admitting: Dermatology

## 2020-09-15 ENCOUNTER — Encounter: Payer: Self-pay | Admitting: Dermatology

## 2020-09-15 DIAGNOSIS — Z1283 Encounter for screening for malignant neoplasm of skin: Secondary | ICD-10-CM | POA: Diagnosis not present

## 2020-09-15 DIAGNOSIS — D1800 Hemangioma unspecified site: Secondary | ICD-10-CM

## 2020-09-15 DIAGNOSIS — L821 Other seborrheic keratosis: Secondary | ICD-10-CM

## 2020-09-27 ENCOUNTER — Telehealth: Payer: Self-pay | Admitting: Family Medicine

## 2020-09-27 NOTE — Telephone Encounter (Signed)
Pt called stating Dr Ethelene Hal wanted to discuss her low Vit D. She has questions about her meds/vitamins.

## 2020-10-03 ENCOUNTER — Encounter: Payer: Self-pay | Admitting: Dermatology

## 2020-10-03 NOTE — Progress Notes (Signed)
   Follow-Up Visit   Subjective  Renee Walters is a 60 y.o. female who presents for the following: Annual Exam (No new concerns).  General skin examination Location:  Duration:  Quality:  Associated Signs/Symptoms: Modifying Factors:  Severity:  Timing: Context:   Objective  Well appearing patient in no apparent distress; mood and affect are within normal limits. Torso - Posterior (Back) Full body skin examination: No atypical pigmented lesions or nonmelanoma skin cancer.  Left Upper Back Multiple light brown and textured 14 mm flattopped papules  Right Abdomen (side) - Upper Multiple 1 to 2 mm smooth red dermal papules    A full examination was performed including scalp, head, eyes, ears, nose, lips, neck, chest, axillae, abdomen, back, buttocks, bilateral upper extremities, bilateral lower extremities, hands, feet, fingers, toes, fingernails, and toenails. All findings within normal limits unless otherwise noted below.  Areas beneath undergarments not fully examined.   Assessment & Plan    Encounter for screening for malignant neoplasm of skin Torso - Posterior (Back)  Annual skin examination.  Self examine with spouse twice annually.  Continue ultraviolet protection.  In  Seborrheic keratosis Left Upper Back  Leave if stable  Hemangioma, unspecified site Right Abdomen (side) - Upper  No intervention necessary      I, Lavonna Monarch, MD, have reviewed all documentation for this visit.  The documentation on 10/03/20 for the exam, diagnosis, procedures, and orders are all accurate and complete.

## 2020-10-12 NOTE — Telephone Encounter (Signed)
Appointment scheduled to discuss labs

## 2020-10-14 ENCOUNTER — Telehealth (INDEPENDENT_AMBULATORY_CARE_PROVIDER_SITE_OTHER): Payer: Commercial Managed Care - PPO | Admitting: Family Medicine

## 2020-10-14 ENCOUNTER — Encounter: Payer: Self-pay | Admitting: Family Medicine

## 2020-10-14 DIAGNOSIS — I5022 Chronic systolic (congestive) heart failure: Secondary | ICD-10-CM

## 2020-10-14 DIAGNOSIS — E875 Hyperkalemia: Secondary | ICD-10-CM

## 2020-10-14 DIAGNOSIS — I456 Pre-excitation syndrome: Secondary | ICD-10-CM | POA: Diagnosis not present

## 2020-10-14 DIAGNOSIS — I10 Essential (primary) hypertension: Secondary | ICD-10-CM | POA: Diagnosis not present

## 2020-10-14 NOTE — Progress Notes (Signed)
Established Patient Office Visit  Subjective:  Patient ID: Renee Walters, female    DOB: April 13, 1960  Age: 60 y.o. MRN: [id number]  CC:  Chief Complaint  Patient presents with   Advice Only    Discuss labs     HPI Renee Walters presents for follow-up of hyperkalemia.  She tells me that her potassium has been elevated in the past and and she is taking Entresto that does have valsartan..  She uses Lasix as needed lower extremity edema associated with CHF and tells me that she has not been taking it recently.  She is also using [place] with glucose urea and is not a diabetic.  She tells me that her last echo showed a EF of 52%.  She denies ongoing shortness of breath dyspnea on exertion or chest pain.  She has follow-up with [facility] on the 26th.  Heart is paced with a automated defibrillator in addition.  Past Medical History:  Diagnosis Date   Allergy    Asthma    Atypical mole 09/10/2002   outer right knee marked (tx exc)   Atypical mole 09/09/2012   LEFT UPPER NOSE MILD   Atypical nevi 03/11/2003   LUQ, SUP. SLIGHT/ MOD.   Atypical nevi 03/11/2003   LUQ INF. SLIGHT ([place])   Atypical nevi 07/25/2006   RIGHT CHEST SLIGHT    Basal cell carcinoma 01/13/2010   INNER LEFT EAR TX MOHS   Family history of early CAD 01/24/2012   GERD (gastroesophageal reflux disease)    occ   Hyperlipidemia    Hypertension    Leg cramping 03/21/2017   Melanoma ([place]) 05/22/2002   right upper outer claf (tx excision)   Neuromuscular disorder ([place])    sciatica   Palpitation 11/05/2014   Sciatica 03/21/2017   Skin cancer    WPW (Wolff-Parkinson-White syndrome)    s/p ablation in 1992    Past Surgical History:  Procedure Laterality Date   ABDOMINAL HYSTERECTOMY     CARDIAC CATHETERIZATION     CARDIAC ELECTROPHYSIOLOGY STUDY AND ABLATION     CHOLECYSTECTOMY     COLONOSCOPY     exploratory GYN surgery     pace maker  07/2018   skin cancer removal     several   TUBAL LIGATION       Family History  Problem Relation Age of Onset   Arthritis Mother    COPD Mother    Diabetes Mother    Hearing loss Mother    Hypertension Mother    Miscarriages / Stillbirths Mother    Heart disease Father 73       s/p CABG, stents   Diabetes Father    Heart attack Father    Hyperlipidemia Father    Hypertension Father    Heart disease Paternal Grandfather    Scientist, research (medical) Parkinson White syndrome Sister    Cancer Sister    Diabetes Sister    Hyperlipidemia Sister    Hypertension Sister    Yves Dill Parkinson White syndrome Other        Sister's daughter   Hyperlipidemia Brother    Hypertension Brother    Hyperlipidemia Brother    Hypertension Brother    Lung cancer Maternal Grandfather    Colon polyps Paternal Grandmother    Colon cancer Neg Hx    Esophageal cancer Neg Hx    Rectal cancer Neg Hx    Stomach cancer Neg Hx     Social History   Socioeconomic History  Marital status: Married    Spouse name: Not on file   Number of children: 2   Years of education: Not on file   Highest education level: Not on file  Occupational History    Employer: [employer]  Tobacco Use   Smoking status: Never   Smokeless tobacco: Never  Vaping Use   Vaping Use: Never used  Substance and Sexual Activity   Alcohol use: Yes    Comment: occasionally   Drug use: No   Sexual activity: Not on file  Other Topics Concern   Not on file  Social History Narrative   Not on file   Social Determinants of Health   Financial Resource Strain: Not on file  Food Insecurity: Not on file  Transportation Needs: Not on file  Physical Activity: Not on file  Stress: Not on file  Social Connections: Not on file  Intimate Partner Violence: Not on file    Outpatient Medications Prior to Visit  Medication Sig Dispense Refill   Albuterol Sulfate (PROAIR RESPICLICK) 123XX123 (90 Base) MCG/ACT AEPB Inhale 1 puff into the lungs 4 (four) times daily as needed. 1 each 1   cetirizine (ZYRTEC) 10 MG tablet  Take 10 mg by mouth daily.     Cholecalciferol (VITAMIN D3) 50000 units TABS Take 50,000 Units by mouth once a week. 1 tab po weekly for 6 weeks. 6 tablet 0   dapagliflozin propanediol (FARXIGA) 10 MG TABS tablet TAKE 1 TABLET BY MOUTH EVERY DAY IN THE MORNING     furosemide (LASIX) 20 MG tablet Take 20 mg by mouth.     ibuprofen (ADVIL,MOTRIN) 200 MG tablet Take 600 mg by mouth every 6 (six) hours as needed.     metoprolol succinate (TOPROL-XL) 100 MG 24 hr tablet TAKE 1 TABLET DAILY WITH OR IMMEDIATELY FOLLOWING A MEAL 90 tablet 0   sacubitril-valsartan (ENTRESTO) 49-51 MG Take 1 tablet by mouth 2 (two) times daily. 60 tablet 11   simvastatin (ZOCOR) 20 MG tablet Take 1 tablet (20 mg total) by mouth at bedtime. 90 tablet 3   Wound Dressings (TRIPLE HELIX COLLAGEN) POWD Apply topically.     calcium-vitamin D (OSCAL WITH D) 500-200 MG-UNIT tablet Take 1 tablet by mouth. (Patient not taking: Reported on 10/14/2020)     cyclobenzaprine (FLEXERIL) 5 MG tablet Take 1 tablet (5 mg total) by mouth 3 (three) times daily as needed for muscle spasms. (Patient not taking: Reported on 10/14/2020) 30 tablet 1   metoprolol tartrate (LOPRESSOR) 25 MG tablet Take 1 tablet (25 mg total) by mouth daily at 3 pm. 180 tablet 3   Omega-3 Fatty Acids (FISH OIL) 1000 MG CAPS Take 2,000 mg by mouth. (Patient not taking: Reported on 10/14/2020)     Facility-Administered Medications Prior to Visit  Medication Dose Route Frequency Provider Last Rate Last Admin   0.9 %  sodium chloride infusion  500 mL Intravenous Once Milus Banister, MD        Allergies  Allergen Reactions   Codeine Nausea Only   Other     Vicryl sutures blisters at site    Penicillins Other (See Comments)    Passed out as a child   Wound Dressing Adhesive Rash    When leaving Band-Aid on for long periods of time (latex)     ROS Review of Systems  Constitutional: Negative.   Respiratory:  Negative for chest tightness, shortness of breath and  wheezing.   Cardiovascular:  Negative for chest pain, palpitations  and leg swelling.  Psychiatric/Behavioral: Negative.       Objective:    Physical Exam Vitals and nursing note reviewed.  Constitutional:      General: She is not in acute distress.    Appearance: Normal appearance. She is not ill-appearing, toxic-appearing or diaphoretic.  HENT:     Head: Normocephalic and atraumatic.  Eyes:     General: No scleral icterus.       Right eye: No discharge.        Left eye: No discharge.     Conjunctiva/sclera: Conjunctivae normal.  Pulmonary:     Effort: Pulmonary effort is normal.  Neurological:     General: No focal deficit present.     Mental Status: She is alert and oriented to person, place, and time.    There were no vitals taken for this visit. Wt Readings from Last 3 Encounters:  05/14/20 183 lb 6.4 oz (83.2 kg)  04/21/20 183 lb 12.8 oz (83.4 kg)  02/05/19 200 lb (90.7 kg)     Health Maintenance Due  Topic Date Due   HIV Screening  Never done   Hepatitis C Screening  Never done   Pneumococcal Vaccine 62-81 Years old (2 - PCV) 04/11/2019   INFLUENZA VACCINE  09/13/2020    There are no preventive care reminders to display for this patient.  Lab Results  Component Value Date   TSH 3.32 03/21/2017   Lab Results  Component Value Date   WBC 6.2 05/20/2020   HGB 13.7 05/20/2020   HCT 42.4 05/20/2020   MCV 88.5 05/20/2020   PLT 239.0 05/20/2020   Lab Results  Component Value Date   NA 140 05/20/2020   K 5.3 No hemolysis seen (H) 05/20/2020   CO2 30 05/20/2020   GLUCOSE 92 05/20/2020   BUN 13 05/20/2020   CREATININE 0.66 05/20/2020   BILITOT 0.4 05/20/2020   ALKPHOS 70 05/20/2020   AST 13 05/20/2020   ALT 16 05/20/2020   PROT 6.6 05/20/2020   ALBUMIN 4.3 05/20/2020   CALCIUM 9.3 05/20/2020   ANIONGAP 13 02/05/2019   GFR 95.85 05/20/2020   Lab Results  Component Value Date   CHOL 183 05/20/2020   Lab Results  Component Value Date   HDL 63.70  05/20/2020   Lab Results  Component Value Date   LDLCALC 93 05/20/2020   Lab Results  Component Value Date   TRIG 132.0 05/20/2020   Lab Results  Component Value Date   CHOLHDL 3 05/20/2020   No results found for: HGBA1C    Assessment & Plan:   Problem List Items Addressed This Visit       Cardiovascular and Mediastinum   Hypertension - Primary   WPW (Wolff-Parkinson-White syndrome)   Chronic systolic congestive heart failure ([place])   Other Visit Diagnoses     Hyperkalemia       Relevant Orders   Potassium       No orders of the defined types were placed in this encounter.   Follow-up: No follow-ups on file.  She will call for a lab appointment to follow-up on her elevated potassium.  Libby Maw, MD Virtual Visit via Video Note  I connected with Renee Walters on 10/14/20 at  2:30 PM EDT by a video enabled telemedicine application and verified that I am speaking with the correct person using two identifiers.  Location: Patient: at home alone in a room.  Provider: work   I discussed the limitations of evaluation  and management by telemedicine and the availability of in person appointments. The patient expressed understanding and agreed to proceed.  History of Present Illness:    Observations/Objective:   Assessment and Plan:   Follow Up Instructions:    I discussed the assessment and treatment plan with the patient. The patient was provided an opportunity to ask questions and all were answered. The patient agreed with the plan and demonstrated an understanding of the instructions.   The patient was advised to call back or seek an in-person evaluation if the symptoms worsen or if the condition fails to improve as anticipated.  I provided 20 minutes of non-face-to-face time during this encounter.   Libby Maw, MD

## 2020-10-27 ENCOUNTER — Other Ambulatory Visit: Payer: Self-pay

## 2020-10-28 ENCOUNTER — Other Ambulatory Visit (INDEPENDENT_AMBULATORY_CARE_PROVIDER_SITE_OTHER): Payer: Commercial Managed Care - PPO

## 2020-10-28 DIAGNOSIS — E875 Hyperkalemia: Secondary | ICD-10-CM | POA: Diagnosis not present

## 2020-10-28 LAB — POTASSIUM: Potassium: 4.8 mEq/L (ref 3.5–5.1)

## 2021-01-08 IMAGING — CT CT ANGIO CHEST
2 of 6 series · 19 of 36 positions shown · IV contrast (omnipaque)
Comparison: None.

CLINICAL DATA: Short of breath and rib pain

EXAM:
CT ANGIOGRAPHY CHEST WITH CONTRAST
TECHNIQUE: Multidetector CT imaging of the chest was performed using the
standard protocol during bolus administration of intravenous
contrast. Multiplanar CT image reconstructions and MIPs were
obtained to evaluate the vascular anatomy.
CONTRAST:  75mL OMNIPAQUE IOHEXOL 350 MG/ML SOLN

[Series 8: pe thins · axial · 0.83mm/px · z∈[+704,+954]mm · 18 of 397 slices shown]
[im 20/397  lung]
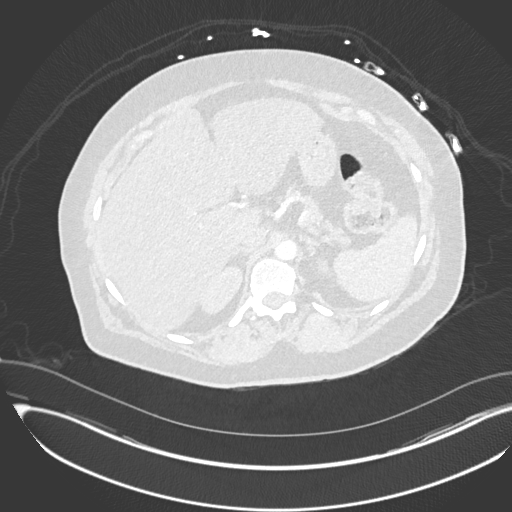
[im 40/397  mediastinal]
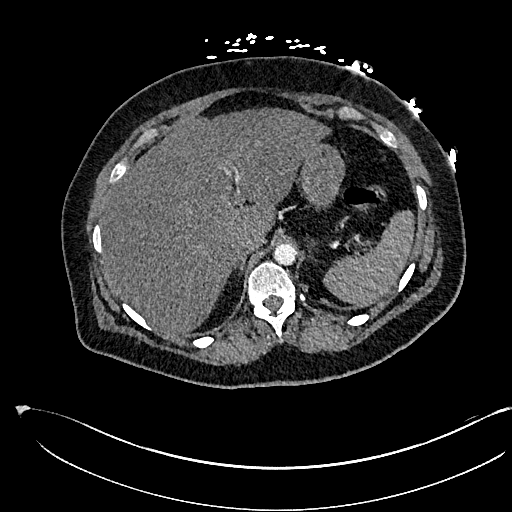
[im 60/397  lung]
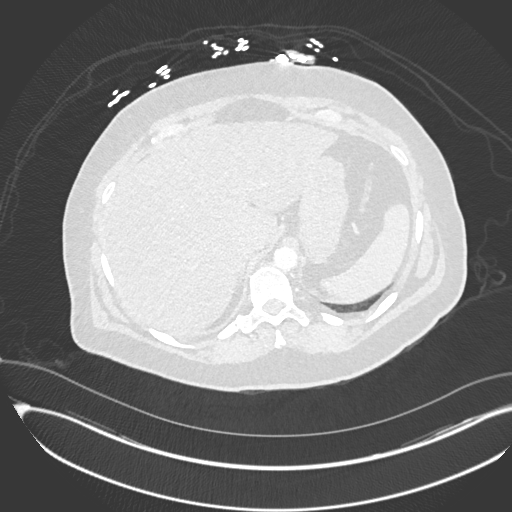
[im 80/397  mediastinal]
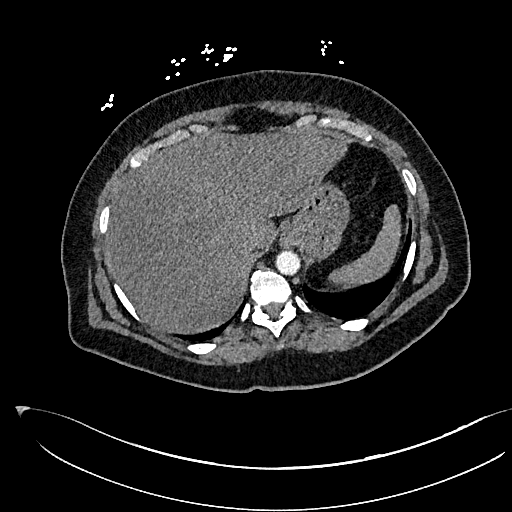
[im 100/397  lung]
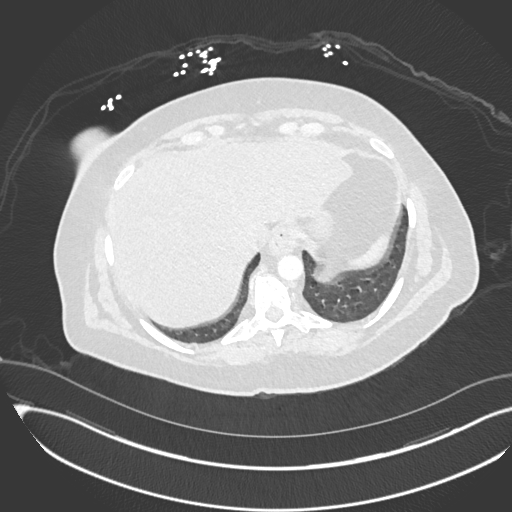
[im 119/397  mediastinal]
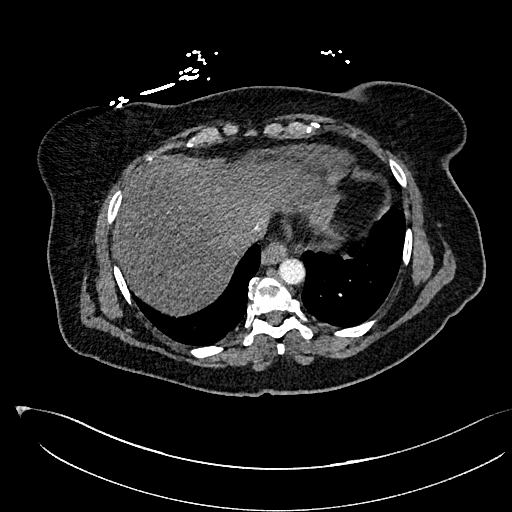
[im 139/397  lung]
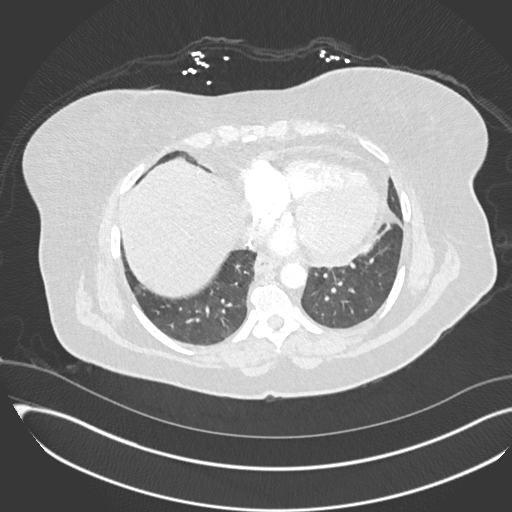
[im 159/397  mediastinal]
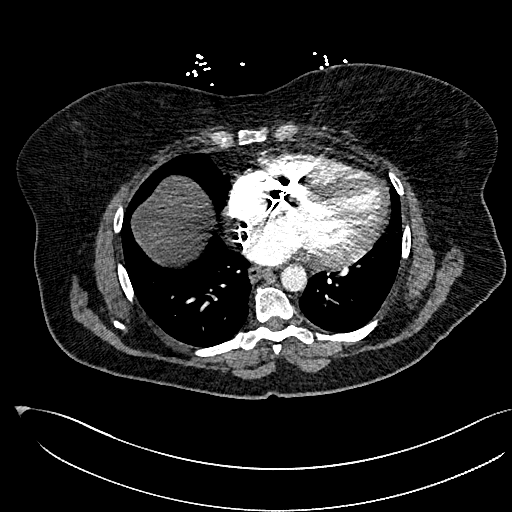
[im 179/397  lung]
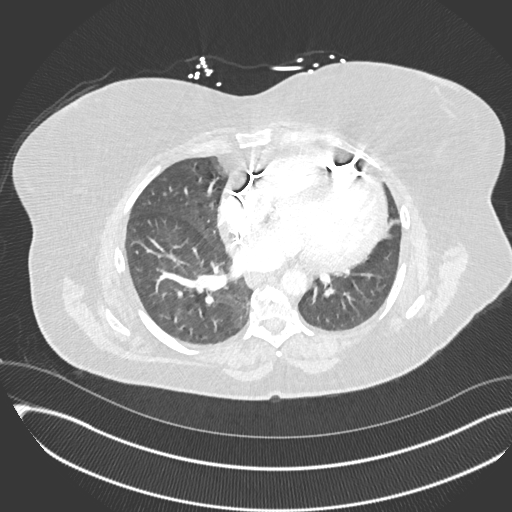
[im 218/397  mediastinal]
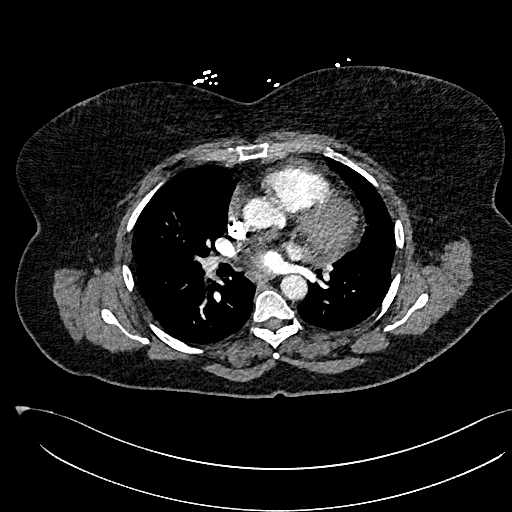
[im 238/397  lung]
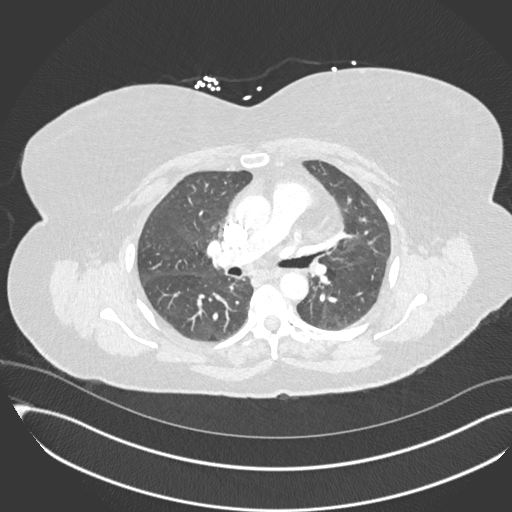
[im 258/397  mediastinal]
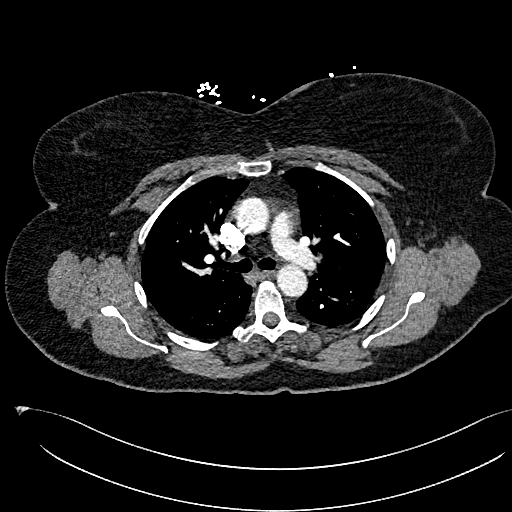
[im 278/397  lung]
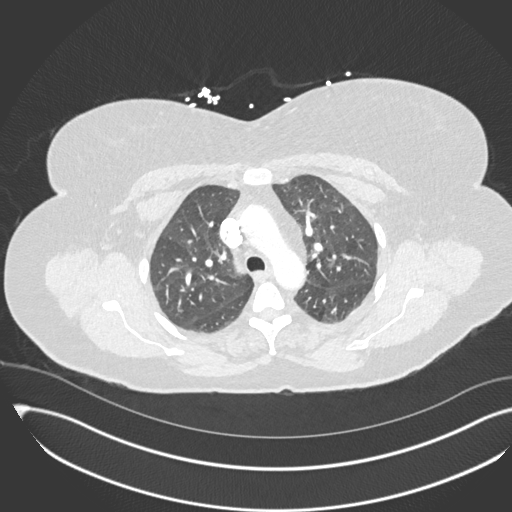
[im 298/397  mediastinal]
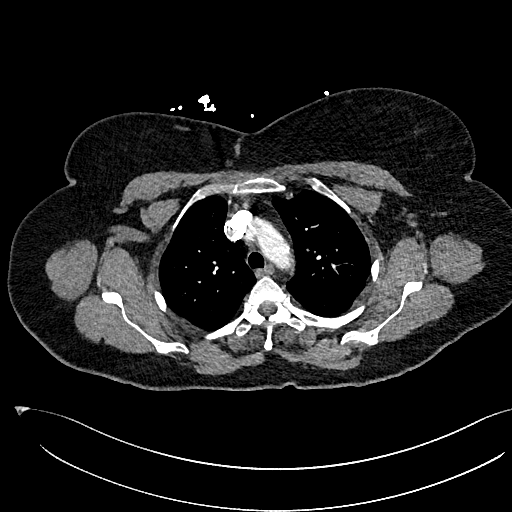
[im 317/397  lung]
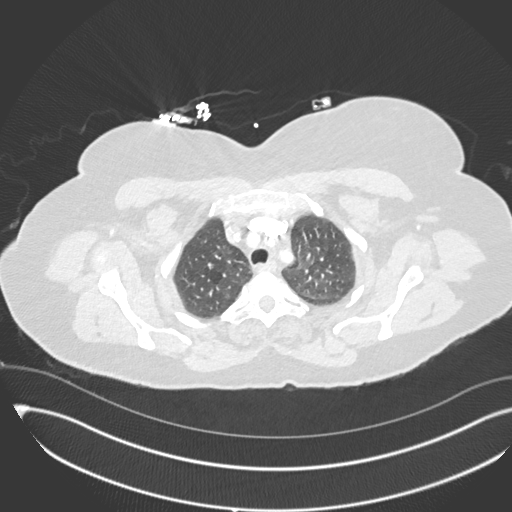
[im 337/397  mediastinal]
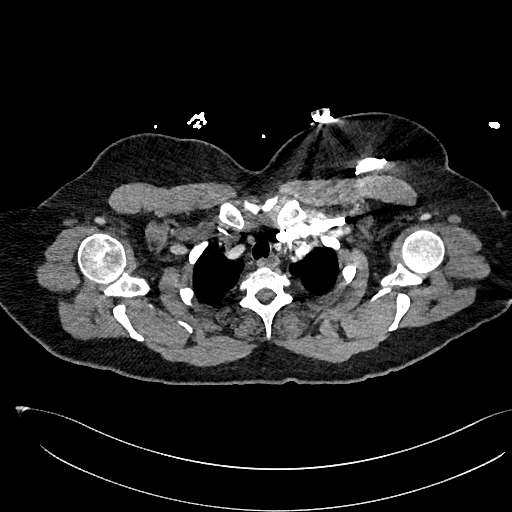
[im 357/397  lung]
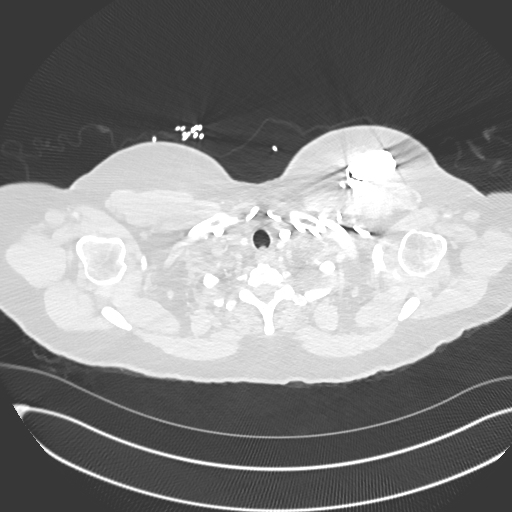
[im 377/397  mediastinal]
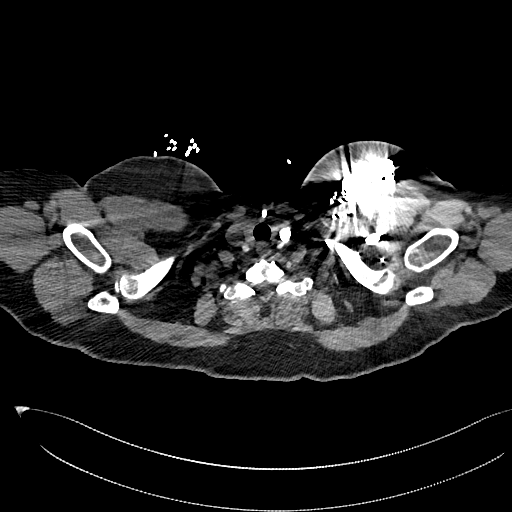

[Series 9: pe 2mm cor · coronal · 0.59mm/px · 1 of 166 slices shown]
[im 83/166  mediastinal]
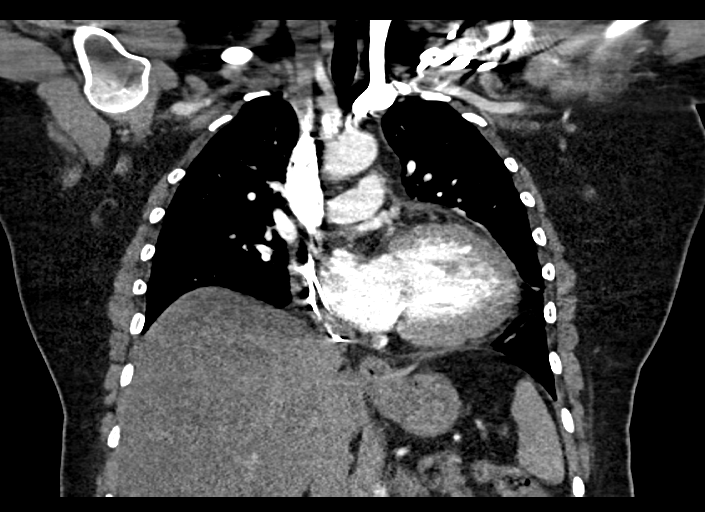

[19 of 36 positions shown; findings below may reference images not displayed]

FINDINGS: Cardiovascular: No filling defects within the pulmonary arteries
arteries to suggest acute pulmonary embolism.

Mediastinum/Nodes: No axillary supraclavicular adenopathy. No
mediastinal hilar adenopathy. No pericardial effusion. Esophagus
normal.

Lungs/Pleura: Mild ground-glass opacities. No infiltrate. No to
pulmonary infarction. No suspicious nodularity. Airways normal

Upper Abdomen: Limited view of the abdomen unremarkable.
Postcholecystectomy. Low-attenuation liver suggests hepatic
steatosis.

Musculoskeletal: No rib fracture. No acute chest wall abnormality.
LEFT-sided pacemaker

Review of the MIP images confirms the above findings.
IMPRESSION: 1. No evidence of pulmonary embolism.
2. Mild diffuse ground-glass opacities could represent air
trapping/atelectasis.
3. No pulmonary infarction or pulmonary infection.
4. No chest wall abnormality.  LEFT-sided pacer noted.
5. Hepatic steatosis

## 2021-05-16 ENCOUNTER — Encounter: Payer: Self-pay | Admitting: Family Medicine

## 2021-05-16 ENCOUNTER — Ambulatory Visit (INDEPENDENT_AMBULATORY_CARE_PROVIDER_SITE_OTHER): Payer: Commercial Managed Care - PPO | Admitting: Family Medicine

## 2021-05-16 VITALS — BP 154/76 | HR 67 | Temp 97.1°F | Ht 61.0 in | Wt 204.2 lb

## 2021-05-16 DIAGNOSIS — Z0000 Encounter for general adult medical examination without abnormal findings: Secondary | ICD-10-CM | POA: Diagnosis not present

## 2021-05-16 DIAGNOSIS — E785 Hyperlipidemia, unspecified: Secondary | ICD-10-CM

## 2021-05-16 DIAGNOSIS — I10 Essential (primary) hypertension: Secondary | ICD-10-CM | POA: Diagnosis not present

## 2021-05-16 DIAGNOSIS — Z114 Encounter for screening for human immunodeficiency virus [HIV]: Secondary | ICD-10-CM

## 2021-05-16 DIAGNOSIS — I456 Pre-excitation syndrome: Secondary | ICD-10-CM

## 2021-05-16 DIAGNOSIS — E559 Vitamin D deficiency, unspecified: Secondary | ICD-10-CM | POA: Diagnosis not present

## 2021-05-16 DIAGNOSIS — Z1159 Encounter for screening for other viral diseases: Secondary | ICD-10-CM

## 2021-05-16 LAB — CBC
HCT: 41.2 % (ref 36.0–46.0)
Hemoglobin: 13.5 g/dL (ref 12.0–15.0)
MCHC: 32.6 g/dL (ref 30.0–36.0)
MCV: 89.8 fl (ref 78.0–100.0)
Platelets: 248 10*3/uL (ref 150.0–400.0)
RBC: 4.59 Mil/uL (ref 3.87–5.11)
RDW: 14.7 % (ref 11.5–15.5)
WBC: 5.7 10*3/uL (ref 4.0–10.5)

## 2021-05-16 LAB — URINALYSIS, ROUTINE W REFLEX MICROSCOPIC
Bilirubin Urine: NEGATIVE
Hgb urine dipstick: NEGATIVE
Ketones, ur: NEGATIVE
Leukocytes,Ua: NEGATIVE
Nitrite: NEGATIVE
RBC / HPF: NONE SEEN (ref 0–?)
Specific Gravity, Urine: <=1.005 — AB (ref 1.000–1.030)
Total Protein, Urine: NEGATIVE
Urine Glucose: >=1000 — AB
Urobilinogen, UA: 0.2 (ref 0.0–1.0)
pH: 6 (ref 5.0–8.0)

## 2021-05-16 LAB — COMPREHENSIVE METABOLIC PANEL
ALT: 31 U/L (ref 0–35)
AST: 24 U/L (ref 0–37)
Albumin: 4.5 g/dL (ref 3.5–5.2)
Alkaline Phosphatase: 67 U/L (ref 39–117)
BUN: 12 mg/dL (ref 6–23)
CO2: 27 mEq/L (ref 19–32)
Calcium: 9.3 mg/dL (ref 8.4–10.5)
Chloride: 102 mEq/L (ref 96–112)
Creatinine, Ser: 0.74 mg/dL (ref 0.40–1.20)
GFR: 87.8 mL/min (ref >60.00)
Glucose, Bld: 107 mg/dL — ABNORMAL HIGH (ref 70–99)
Potassium: 4.6 mEq/L (ref 3.5–5.1)
Sodium: 137 mEq/L (ref 135–145)
Total Bilirubin: 0.5 mg/dL (ref 0.2–1.2)
Total Protein: 7 g/dL (ref 6.0–8.3)

## 2021-05-16 LAB — LIPID PANEL
Cholesterol: 200 mg/dL (ref 0–200)
HDL: 64.3 mg/dL (ref >39.00)
LDL Cholesterol: 110 mg/dL — ABNORMAL HIGH (ref 0–99)
NonHDL: 135.54
Total CHOL/HDL Ratio: 3
Triglycerides: 126 mg/dL (ref 0.0–149.0)
VLDL: 25.2 mg/dL (ref 0.0–40.0)

## 2021-05-16 LAB — HEMOGLOBIN A1C: Hgb A1c MFr Bld: 6.6 % — ABNORMAL HIGH (ref 4.6–6.5)

## 2021-05-16 LAB — VITAMIN D 25 HYDROXY (VIT D DEFICIENCY, FRACTURES): VITD: 27.59 ng/mL — ABNORMAL LOW (ref 30.00–100.00)

## 2021-05-16 LAB — LDL CHOLESTEROL, DIRECT: Direct LDL: 121 mg/dL

## 2021-05-16 NOTE — Progress Notes (Addendum)
? ?Established Patient Office Visit ? ?Subjective:  ?Patient ID: Renee Walters, female    DOB: 01-26-1961  Age: 61 y.o. MRN: [id number] ? ?CC:  ?Chief Complaint  ?Patient presents with  ? Annual Exam  ?  CPE no concerns. Patient fasting.   ? ? ?HPI ?Renee Walters presents for physical exam.  She is fasting this morning.  Has scheduled appointment with GYN.  Continues follow-up with [facility].  She did not take her blood pressure meds this morning.  She is fasting.  Not currently exercising much.  Denies any shortness of breath chest pain or dyspnea on exertion. ? ?Past Medical History:  ?Diagnosis Date  ? Allergy   ? Asthma   ? Atypical mole 09/10/2002  ? outer right knee marked (tx exc)  ? Atypical mole 09/09/2012  ? LEFT UPPER NOSE MILD  ? Atypical nevi 03/11/2003  ? LUQ, SUP. SLIGHT/ MOD.  ? Atypical nevi 03/11/2003  ? LUQ INF. SLIGHT [facility])  ? Atypical nevi 07/25/2006  ? RIGHT CHEST SLIGHT   ? Basal cell carcinoma 01/13/2010  ? INNER LEFT EAR TX MOHS  ? Family history of early CAD 01/24/2012  ? GERD (gastroesophageal reflux disease)   ? occ  ? Hyperlipidemia   ? Hypertension   ? Leg cramping 03/21/2017  ? Melanoma ([place]) 05/22/2002  ? right upper outer claf (tx excision)  ? Neuromuscular disorder ([place])   ? sciatica  ? Palpitation 11/05/2014  ? Sciatica 03/21/2017  ? Skin cancer   ? WPW (Wolff-Parkinson-White syndrome)   ? s/p ablation in 1992  ? ? ?Past Surgical History:  ?Procedure Laterality Date  ? ABDOMINAL HYSTERECTOMY    ? CARDIAC CATHETERIZATION    ? CARDIAC ELECTROPHYSIOLOGY STUDY AND ABLATION    ? CHOLECYSTECTOMY    ? COLONOSCOPY    ? exploratory GYN surgery    ? pace maker  07/2018  ? skin cancer removal    ? several  ? TUBAL LIGATION    ? ? ?Family History  ?Problem Relation Age of Onset  ? Arthritis Mother   ? COPD Mother   ? Diabetes Mother   ? Hearing loss Mother   ? Hypertension Mother   ? Miscarriages / [place] Mother   ? Heart disease Father 30  ?     s/p CABG, stents  ? Diabetes  Father   ? Heart attack Father   ? Hyperlipidemia Father   ? Hypertension Father   ? Heart disease Paternal Grandfather   ? Yves Dill Parkinson White syndrome Sister   ? Cancer Sister   ? Diabetes Sister   ? Hyperlipidemia Sister   ? Hypertension Sister   ? Yves Dill Parkinson White syndrome Other   ?     Sister's daughter  ? Hyperlipidemia Brother   ? Hypertension Brother   ? Hyperlipidemia Brother   ? Hypertension Brother   ? Lung cancer Maternal Grandfather   ? Colon polyps Paternal Grandmother   ? Colon cancer Neg Hx   ? Esophageal cancer Neg Hx   ? Rectal cancer Neg Hx   ? Stomach cancer Neg Hx   ? ? ?Social History  ? ?Socioeconomic History  ? Marital status: Married  ?  Spouse name: Not on file  ? Number of children: 2  ? Years of education: Not on file  ? Highest education level: Not on file  ?Occupational History  ?  Employer: [employer]  ?Tobacco Use  ? Smoking status: Never  ? Smokeless  tobacco: Never  ?Vaping Use  ? Vaping Use: Never used  ?Substance and Sexual Activity  ? Alcohol use: Yes  ?  Comment: occasionally  ? Drug use: No  ? Sexual activity: Not on file  ?Other Topics Concern  ? Not on file  ?Social History Narrative  ? Not on file  ? ?Social Determinants of Health  ? ?Financial Resource Strain: Not on file  ?Food Insecurity: Not on file  ?Transportation Needs: Not on file  ?Physical Activity: Not on file  ?Stress: Not on file  ?Social Connections: Not on file  ?Intimate Partner Violence: Not on file  ? ? ?Outpatient Medications Prior to Visit  ?Medication Sig Dispense Refill  ? Albuterol Sulfate (PROAIR RESPICLICK) 001 (90 Base) MCG/ACT AEPB Inhale 1 puff into the lungs 4 (four) times daily as needed. 1 each 1  ? cetirizine (ZYRTEC) 10 MG tablet Take 10 mg by mouth daily.    ? dapagliflozin propanediol (FARXIGA) 10 MG TABS tablet TAKE 1 TABLET BY MOUTH EVERY DAY IN THE MORNING    ? furosemide (LASIX) 20 MG tablet Take 20 mg by mouth.    ? ibuprofen (ADVIL,MOTRIN) 200 MG tablet Take 600 mg by mouth every  6 (six) hours as needed.    ? metoprolol succinate (TOPROL-XL) 100 MG 24 hr tablet TAKE 1 TABLET DAILY WITH OR IMMEDIATELY FOLLOWING A MEAL 90 tablet 0  ? sacubitril-valsartan (ENTRESTO) 49-51 MG Take 1 tablet by mouth 2 (two) times daily. 60 tablet 11  ? simvastatin (ZOCOR) 20 MG tablet Take 1 tablet (20 mg total) by mouth at bedtime. 90 tablet 3  ? Wound Dressings (TRIPLE HELIX COLLAGEN) POWD Apply topically.    ? Cholecalciferol (VITAMIN D3) 50000 units TABS Take 50,000 Units by mouth once a week. 1 tab po weekly for 6 weeks. (Patient not taking: Reported on 05/16/2021) 6 tablet 0  ? Nutritional Supplements (LPS SUGAR FREE) LIQD Take by mouth.    ? calcium-vitamin D (OSCAL WITH D) 500-200 MG-UNIT tablet Take 1 tablet by mouth. (Patient not taking: Reported on 10/14/2020)    ? cyclobenzaprine (FLEXERIL) 5 MG tablet Take 1 tablet (5 mg total) by mouth 3 (three) times daily as needed for muscle spasms. (Patient not taking: Reported on 10/14/2020) 30 tablet 1  ? ?Facility-Administered Medications Prior to Visit  ?Medication Dose Route Frequency Provider Last Rate Last Admin  ? 0.9 %  sodium chloride infusion  500 mL Intravenous Once Milus Banister, MD      ? ? ?Allergies  ?Allergen Reactions  ? Codeine Nausea Only  ? Other   ?  Vicryl sutures blisters at site   ? Penicillins Other (See Comments)  ?  Passed out as a child  ? Neomycin-Bacitracin Zn-Polymyx Rash  ? Wound Dressing Adhesive Rash  ?  When leaving Band-Aid on for long periods of time (latex)   ? ? ?ROS ?Review of Systems  ?Constitutional:  Negative for chills, diaphoresis, fatigue, fever and unexpected weight change.  ?HENT: Negative.    ?Eyes:  Negative for photophobia and visual disturbance.  ?Respiratory: Negative.  Negative for shortness of breath.   ?Cardiovascular: Negative.  Negative for chest pain.  ?Gastrointestinal: Negative.   ?Endocrine: Negative for polyphagia and polyuria.  ?Genitourinary: Negative.   ?Musculoskeletal:  Negative for gait problem  and joint swelling.  ?Neurological:  Negative for speech difficulty and weakness.  ? ?  05/16/2021  ?  8:30 AM 05/16/2021  ?  8:09 AM 10/14/2020  ?  2:22  PM  ?Depression screen PHQ 2/9  ?Decreased Interest 0 0 0  ?Down, Depressed, Hopeless 0 0 0  ?PHQ - 2 Score 0 0 0  ?Altered sleeping 0    ?Tired, decreased energy 0    ?Change in appetite 1    ?Feeling bad or failure about yourself  0    ?Trouble concentrating 0    ?Moving slowly or fidgety/restless 0    ?Suicidal thoughts 0    ?PHQ-9 Score 1    ?Difficult doing work/chores Not difficult at all    ? ? ? ?  ?Objective:  ?  ?Physical Exam ?Vitals and nursing note reviewed.  ?Constitutional:   ?   General: She is not in acute distress. ?   Appearance: Normal appearance. She is not ill-appearing, toxic-appearing or diaphoretic.  ?HENT:  ?   Head: Normocephalic and atraumatic.  ?   Right Ear: Tympanic membrane, ear canal and external ear normal.  ?   Left Ear: Tympanic membrane, ear canal and external ear normal.  ?   Mouth/Throat:  ?   Mouth: Mucous membranes are moist.  ?   Pharynx: Oropharynx is clear. No oropharyngeal exudate or posterior oropharyngeal erythema.  ?Eyes:  ?   General: No scleral icterus.    ?   Right eye: No discharge.     ?   Left eye: No discharge.  ?   Conjunctiva/sclera: Conjunctivae normal.  ?   Pupils: Pupils are equal, round, and reactive to light.  ?Cardiovascular:  ?   Rate and Rhythm: Normal rate and regular rhythm.  ?Pulmonary:  ?   Effort: Pulmonary effort is normal.  ?   Breath sounds: Normal breath sounds.  ?Abdominal:  ?   General: Bowel sounds are normal.  ?Musculoskeletal:  ?   Cervical back: No rigidity or tenderness.  ?   Right lower leg: No edema.  ?   Left lower leg: No edema.  ?Lymphadenopathy:  ?   Cervical: No cervical adenopathy.  ?Skin: ?   General: Skin is warm and dry.  ?Neurological:  ?   Mental Status: She is alert and oriented to person, place, and time.  ?Psychiatric:     ?   Mood and Affect: Mood normal.     ?   Behavior:  Behavior normal.  ? ? ?BP (!) 154/76 (BP Location: Right Arm, Patient Position: Sitting, Cuff Size: Normal)   Pulse 67   Temp (!) 97.1 ?F (36.2 ?C) (Temporal)   Ht '5\' 1"'$  (1.549 m)   Wt 204 lb 3.2 oz (92.6 k

## 2021-05-17 LAB — HIV ANTIBODY (ROUTINE TESTING W REFLEX): HIV 1&2 Ab, 4th Generation: NONREACTIVE

## 2021-05-17 LAB — HEPATITIS C ANTIBODY
Hepatitis C Ab: NONREACTIVE
SIGNAL TO CUT-OFF: 0.14 (ref <1.00)

## 2021-05-27 ENCOUNTER — Telehealth: Payer: Self-pay | Admitting: Family Medicine

## 2021-05-27 DIAGNOSIS — S46119A Strain of muscle, fascia and tendon of long head of biceps, unspecified arm, initial encounter: Secondary | ICD-10-CM | POA: Insufficient documentation

## 2021-05-27 NOTE — Telephone Encounter (Signed)
FYI: Pt is wanting Dr. Ethelene Hal to know her BP has been running high 140-155/70-80. ?

## 2021-05-27 NOTE — Telephone Encounter (Signed)
Returned patients call regarding BP readings phone hung up tried calling back no answer. Patient has appointment on 05/30/21.  ?

## 2021-05-30 ENCOUNTER — Telehealth (INDEPENDENT_AMBULATORY_CARE_PROVIDER_SITE_OTHER): Payer: Commercial Managed Care - PPO | Admitting: Family Medicine

## 2021-05-30 ENCOUNTER — Encounter: Payer: Self-pay | Admitting: Family Medicine

## 2021-05-30 VITALS — Ht 61.0 in

## 2021-05-30 DIAGNOSIS — E785 Hyperlipidemia, unspecified: Secondary | ICD-10-CM | POA: Diagnosis not present

## 2021-05-30 DIAGNOSIS — E559 Vitamin D deficiency, unspecified: Secondary | ICD-10-CM | POA: Diagnosis not present

## 2021-05-30 DIAGNOSIS — I10 Essential (primary) hypertension: Secondary | ICD-10-CM

## 2021-05-30 DIAGNOSIS — R7303 Prediabetes: Secondary | ICD-10-CM

## 2021-05-30 MED ORDER — SIMVASTATIN 40 MG PO TABS: 40.0000 mg | ORAL_TABLET | Freq: Every day | ORAL | 3 refills | Status: DC

## 2021-05-30 MED ORDER — VITAMIN D (ERGOCALCIFEROL) 1.25 MG (50000 UNIT) PO CAPS: 50000.0000 [IU] | ORAL_CAPSULE | ORAL | 1 refills | Status: DC

## 2021-05-30 MED ORDER — AMLODIPINE BESYLATE 5 MG PO TABS: 5.0000 mg | ORAL_TABLET | Freq: Every day | ORAL | 1 refills | Status: DC

## 2021-05-30 MED ORDER — PRAVASTATIN SODIUM 40 MG PO TABS: 40.0000 mg | ORAL_TABLET | Freq: Every day | ORAL | 1 refills | Status: DC

## 2021-05-30 NOTE — Progress Notes (Addendum)
? ?Established Patient Office Visit ? ?Subjective:  ?Patient ID: Renee Walters, female    DOB: 11/05/1960  Age: 61 y.o. MRN: [id number] ? ?CC:  ?Chief Complaint  ?Patient presents with  ? Advice Only  ?  Discuss labs  ? ? ?HPI ?[name] presents for follow-up of last visit and labs that were drawn.  Vitamin D levels were low.  Has not had a chance to start the high-dose weekly pill.  LDL cholesterol was 121.  She suffers from chronic systolic heart failure.  Blood pressure has been averaging in the upper 140s over 80s.  Hemoglobin A1c was elevated in the prediabetic range despite the fact that she has been taking [place] for her heart.  She has no history of diabetes.  She has no history of gestational diabetes. ? ?Past Medical History:  ?Diagnosis Date  ? Allergy   ? Asthma   ? Atypical mole 09/10/2002  ? outer right knee marked (tx exc)  ? Atypical mole 09/09/2012  ? LEFT UPPER NOSE MILD  ? Atypical nevi 03/11/2003  ? LUQ, SUP. SLIGHT/ MOD.  ? Atypical nevi 03/11/2003  ? LUQ INF. SLIGHT [facility])  ? Atypical nevi 07/25/2006  ? RIGHT CHEST SLIGHT   ? Basal cell carcinoma 01/13/2010  ? INNER LEFT EAR TX MOHS  ? Family history of early CAD 01/24/2012  ? GERD (gastroesophageal reflux disease)   ? occ  ? Hyperlipidemia   ? Hypertension   ? Leg cramping 03/21/2017  ? Melanoma ([place]) 05/22/2002  ? right upper outer claf (tx excision)  ? Neuromuscular disorder ([place])   ? sciatica  ? Palpitation 11/05/2014  ? Sciatica 03/21/2017  ? Skin cancer   ? WPW (Wolff-Parkinson-White syndrome)   ? s/p ablation in 1992  ? ? ?Past Surgical History:  ?Procedure Laterality Date  ? ABDOMINAL HYSTERECTOMY    ? CARDIAC CATHETERIZATION    ? CARDIAC ELECTROPHYSIOLOGY STUDY AND ABLATION    ? CHOLECYSTECTOMY    ? COLONOSCOPY    ? exploratory GYN surgery    ? pace maker  07/2018  ? skin cancer removal    ? several  ? TUBAL LIGATION    ? ? ?Family History  ?Problem Relation Age of Onset  ? Arthritis Mother   ? COPD Mother   ? Diabetes Mother    ? Hearing loss Mother   ? Hypertension Mother   ? Miscarriages / [place] Mother   ? Heart disease Father 74  ?     s/p CABG, stents  ? Diabetes Father   ? Heart attack Father   ? Hyperlipidemia Father   ? Hypertension Father   ? Heart disease Paternal Grandfather   ? Yves Dill Parkinson White syndrome Sister   ? Cancer Sister   ? Diabetes Sister   ? Hyperlipidemia Sister   ? Hypertension Sister   ? Yves Dill Parkinson White syndrome Other   ?     Sister's daughter  ? Hyperlipidemia Brother   ? Hypertension Brother   ? Hyperlipidemia Brother   ? Hypertension Brother   ? Lung cancer Maternal Grandfather   ? Colon polyps Paternal Grandmother   ? Colon cancer Neg Hx   ? Esophageal cancer Neg Hx   ? Rectal cancer Neg Hx   ? Stomach cancer Neg Hx   ? ? ?Social History  ? ?Socioeconomic History  ? Marital status: Married  ?  Spouse name: Not on file  ? Number of children: 2  ? Years of  education: Not on file  ? Highest education level: Not on file  ?Occupational History  ?  Employer: [employer]  ?Tobacco Use  ? Smoking status: Never  ? Smokeless tobacco: Never  ?Vaping Use  ? Vaping Use: Never used  ?Substance and Sexual Activity  ? Alcohol use: Yes  ?  Comment: occasionally  ? Drug use: No  ? Sexual activity: Not on file  ?Other Topics Concern  ? Not on file  ?Social History Narrative  ? Not on file  ? ?Social Determinants of Health  ? ?Financial Resource Strain: Not on file  ?Food Insecurity: Not on file  ?Transportation Needs: Not on file  ?Physical Activity: Not on file  ?Stress: Not on file  ?Social Connections: Not on file  ?Intimate Partner Violence: Not on file  ? ? ?Outpatient Medications Prior to Visit  ?Medication Sig Dispense Refill  ? Albuterol Sulfate (PROAIR RESPICLICK) 829 (90 Base) MCG/ACT AEPB Inhale 1 puff into the lungs 4 (four) times daily as needed. 1 each 1  ? cetirizine (ZYRTEC) 10 MG tablet Take 10 mg by mouth daily.    ? dapagliflozin propanediol (FARXIGA) 10 MG TABS tablet TAKE 1 TABLET BY MOUTH  EVERY DAY IN THE MORNING    ? furosemide (LASIX) 20 MG tablet Take 20 mg by mouth.    ? ibuprofen (ADVIL,MOTRIN) 200 MG tablet Take 600 mg by mouth every 6 (six) hours as needed.    ? metoprolol succinate (TOPROL-XL) 100 MG 24 hr tablet TAKE 1 TABLET DAILY WITH OR IMMEDIATELY FOLLOWING A MEAL 90 tablet 0  ? Nutritional Supplements (LPS SUGAR FREE) LIQD Take by mouth.    ? sacubitril-valsartan (ENTRESTO) 49-51 MG Take 1 tablet by mouth 2 (two) times daily. 60 tablet 11  ? Wound Dressings (TRIPLE HELIX COLLAGEN) POWD Apply topically.    ? simvastatin (ZOCOR) 20 MG tablet Take 1 tablet (20 mg total) by mouth at bedtime. 90 tablet 3  ? Cholecalciferol (VITAMIN D3) 50000 units TABS Take 50,000 Units by mouth once a week. 1 tab po weekly for 6 weeks. (Patient not taking: Reported on 05/16/2021) 6 tablet 0  ? ?Facility-Administered Medications Prior to Visit  ?Medication Dose Route Frequency Provider Last Rate Last Admin  ? 0.9 %  sodium chloride infusion  500 mL Intravenous Once Milus Banister, MD      ? ? ?Allergies  ?Allergen Reactions  ? Codeine Nausea Only  ? Other   ?  Vicryl sutures blisters at site   ? Penicillins Other (See Comments)  ?  Passed out as a child  ? Neomycin-Bacitracin Zn-Polymyx Rash  ? Wound Dressing Adhesive Rash  ?  When leaving Band-Aid on for long periods of time (latex)   ? ? ?ROS ?Review of Systems  ?Constitutional:  Negative for chills, diaphoresis, fatigue, fever and unexpected weight change.  ?HENT: Negative.    ?Eyes:  Negative for photophobia and visual disturbance.  ?Respiratory: Negative.    ?Cardiovascular: Negative.   ?Gastrointestinal: Negative.   ?Endocrine: Negative for polyphagia and polyuria.  ?Genitourinary: Negative.   ?Musculoskeletal:  Negative for gait problem and joint swelling.  ?Neurological:  Negative for speech difficulty and weakness.  ?Psychiatric/Behavioral: Negative.    ? ?  ?Objective:  ?  ?Physical Exam ?Vitals and nursing note reviewed.  ?Constitutional:   ?    General: She is not in acute distress. ?   Appearance: Normal appearance. She is not ill-appearing, toxic-appearing or diaphoretic.  ?HENT:  ?   Head:  Normocephalic and atraumatic.  ?   Right Ear: External ear normal.  ?   Left Ear: External ear normal.  ?Eyes:  ?   General: No scleral icterus.    ?   Right eye: No discharge.     ?   Left eye: No discharge.  ?   Extraocular Movements: Extraocular movements intact.  ?   Conjunctiva/sclera: Conjunctivae normal.  ?Pulmonary:  ?   Effort: Pulmonary effort is normal.  ?Skin: ?   General: Skin is warm and dry.  ?Neurological:  ?   Mental Status: She is alert and oriented to person, place, and time.  ?Psychiatric:     ?   Mood and Affect: Mood normal.     ?   Behavior: Behavior normal.  ? ? ?Ht '5\' 1"'$  (1.549 m)   BMI 38.58 kg/m?  ?Wt Readings from Last 3 Encounters:  ?05/16/21 204 lb 3.2 oz (92.6 kg)  ?05/14/20 183 lb 6.4 oz (83.2 kg)  ?04/21/20 183 lb 12.8 oz (83.4 kg)  ? ? ? ?There are no preventive care reminders to display for this patient. ? ?There are no preventive care reminders to display for this patient. ? ?Lab Results  ?Component Value Date  ? TSH 3.32 03/21/2017  ? ?Lab Results  ?Component Value Date  ? WBC 5.7 05/16/2021  ? HGB 13.5 05/16/2021  ? HCT 41.2 05/16/2021  ? MCV 89.8 05/16/2021  ? PLT 248.0 05/16/2021  ? ?Lab Results  ?Component Value Date  ? NA 137 05/16/2021  ? K 4.6 05/16/2021  ? CO2 27 05/16/2021  ? GLUCOSE 107 (H) 05/16/2021  ? BUN 12 05/16/2021  ? CREATININE 0.74 05/16/2021  ? BILITOT 0.5 05/16/2021  ? ALKPHOS 67 05/16/2021  ? AST 24 05/16/2021  ? ALT 31 05/16/2021  ? PROT 7.0 05/16/2021  ? ALBUMIN 4.5 05/16/2021  ? CALCIUM 9.3 05/16/2021  ? ANIONGAP 13 02/05/2019  ? GFR 87.80 05/16/2021  ? ?Lab Results  ?Component Value Date  ? CHOL 200 05/16/2021  ? ?Lab Results  ?Component Value Date  ? HDL 64.30 05/16/2021  ? ?Lab Results  ?Component Value Date  ? LDLCALC 110 (H) 05/16/2021  ? ?Lab Results  ?Component Value Date  ? TRIG 126.0 05/16/2021   ? ?Lab Results  ?Component Value Date  ? CHOLHDL 3 05/16/2021  ? ?Lab Results  ?Component Value Date  ? HGBA1C 6.6 (H) 05/16/2021  ? ? ?  ?Assessment & Plan:  ? ?Problem List Items Addressed This Visit   ? ?

## 2021-05-31 MED ORDER — ATORVASTATIN CALCIUM 40 MG PO TABS: 40.0000 mg | ORAL_TABLET | Freq: Every day | ORAL | 3 refills | Status: DC

## 2021-05-31 NOTE — Addendum Note (Signed)
Addended by: Jon Billings on: 05/31/2021 11:23 AM ? ? Modules accepted: Orders ? ?

## 2021-05-31 NOTE — Addendum Note (Signed)
Addended by: Jon Billings on: 05/31/2021 10:56 AM ? ? Modules accepted: Orders ? ?

## 2021-06-01 ENCOUNTER — Encounter: Payer: Self-pay | Admitting: Family Medicine

## 2021-06-02 NOTE — Telephone Encounter (Signed)
Pt called regarding this msg about her medications ?

## 2021-06-09 ENCOUNTER — Other Ambulatory Visit: Payer: Self-pay

## 2021-06-09 DIAGNOSIS — I10 Essential (primary) hypertension: Secondary | ICD-10-CM

## 2021-06-09 MED ORDER — AMLODIPINE BESYLATE 5 MG PO TABS: 5.0000 mg | ORAL_TABLET | Freq: Every day | ORAL | 1 refills | Status: DC

## 2021-08-18 ENCOUNTER — Encounter: Payer: Self-pay | Admitting: Family Medicine

## 2021-08-18 ENCOUNTER — Ambulatory Visit (INDEPENDENT_AMBULATORY_CARE_PROVIDER_SITE_OTHER): Payer: Commercial Managed Care - PPO | Admitting: Family Medicine

## 2021-08-18 VITALS — BP 146/90 | HR 65 | Temp 97.8°F | Ht 61.0 in | Wt 202.4 lb

## 2021-08-18 DIAGNOSIS — I10 Essential (primary) hypertension: Secondary | ICD-10-CM

## 2021-08-18 DIAGNOSIS — R252 Cramp and spasm: Secondary | ICD-10-CM

## 2021-08-18 DIAGNOSIS — E559 Vitamin D deficiency, unspecified: Secondary | ICD-10-CM

## 2021-08-18 DIAGNOSIS — R7303 Prediabetes: Secondary | ICD-10-CM

## 2021-08-18 DIAGNOSIS — E785 Hyperlipidemia, unspecified: Secondary | ICD-10-CM

## 2021-08-18 NOTE — Progress Notes (Signed)
Established Patient Office Visit  Subjective   Patient ID: Renee Walters, female    DOB: 1960-07-25  Age: 61 y.o. MRN: [id number]  Chief Complaint  Patient presents with   Office Visit    3 month F/u HTN Checks BP 3 x a week      HPI for follow-up of hypertension, prediabetes, hyperlipidemia and vitamin D deficiency.  Tolerating weekly high-dose vitamin D pill.  Having no issues she believes taking the higher dose of atorvastatin.  She has been noting some pain on the muscle cramping.  Has tolerated the higher dose of [place].  Blood pressure has been averaging 137/70 on the 90-day average status post addition of amlodipine 5 mg.  No increased swelling in her lower extremities.  She is only using the Lasix as needed..  No regular exercise.  Has been eating out a lot.    Review of Systems  Constitutional:  Negative for chills, diaphoresis, malaise/fatigue and weight loss.  HENT: Negative.    Eyes: Negative.  Negative for blurred vision and double vision.  Respiratory:  Negative for shortness of breath.   Cardiovascular:  Positive for leg swelling. Negative for chest pain and palpitations.  Gastrointestinal:  Negative for abdominal pain.  Genitourinary: Negative.  Negative for frequency.  Musculoskeletal:  Negative for falls and myalgias.  Neurological:  Negative for speech change, loss of consciousness and weakness.  Psychiatric/Behavioral: Negative.        Objective:     BP (!) 146/90 (BP Location: Right Arm, Patient Position: Sitting, Cuff Size: Normal)   Pulse 65   Temp 97.8 F (36.6 C) (Temporal)   Ht '5\' 1"'$  (1.549 m)   Wt 202 lb 6.4 oz (91.8 kg)   SpO2 96%   BMI 38.24 kg/m    Physical Exam Constitutional:      General: She is not in acute distress.    Appearance: Normal appearance. She is not ill-appearing, toxic-appearing or diaphoretic.  HENT:     Head: Normocephalic and atraumatic.     Right Ear: External ear normal.     Left Ear: External ear normal.      Mouth/Throat:     Mouth: Mucous membranes are moist.     Pharynx: Oropharynx is clear. No oropharyngeal exudate or posterior oropharyngeal erythema.  Eyes:     General: No scleral icterus.       Right eye: No discharge.        Left eye: No discharge.     Extraocular Movements: Extraocular movements intact.     Conjunctiva/sclera: Conjunctivae normal.     Pupils: Pupils are equal, round, and reactive to light.  Cardiovascular:     Rate and Rhythm: Normal rate and regular rhythm.  Pulmonary:     Effort: Pulmonary effort is normal. No respiratory distress.     Breath sounds: Normal breath sounds. No rhonchi.  Abdominal:     General: Bowel sounds are normal.     Tenderness: There is no abdominal tenderness. There is no guarding.  Musculoskeletal:     Right lower leg: Edema (trace) present.     Left lower leg: Edema (trace) present.  Skin:    General: Skin is warm and dry.  Neurological:     Mental Status: She is alert and oriented to person, place, and time.  Psychiatric:        Mood and Affect: Mood normal.        Behavior: Behavior normal.      No results found  for any visits on 08/18/21.    The 10-year ASCVD risk score (Arnett DK, et al., 2019) is: 5.2%    Assessment & Plan:   Problem List Items Addressed This Visit       Cardiovascular and Mediastinum   Essential hypertension   Relevant Orders   Basic metabolic panel     Other   Hyperlipidemia - Primary   Relevant Orders   Lipid panel   Vitamin D deficiency   Relevant Orders   VITAMIN D 25 Hydroxy (Vit-D Deficiency, Fractures)   Pre-diabetes   Relevant Orders   Basic metabolic panel   Hemoglobin A1c   Other Visit Diagnoses     Muscle cramp, nocturnal       Relevant Orders   Magnesium       Return in about 6 months (around 02/18/2022), or Please exercise and continue weight loss efforts..  Will return fasting for above ordered blood work.  Continue current medications.  We will exercise and try to  lose some weight.  Information was given on managing hypertension.  Okay to 2-week statin holiday to see if her spasms improved  Libby Maw, MD

## 2021-08-26 ENCOUNTER — Other Ambulatory Visit (INDEPENDENT_AMBULATORY_CARE_PROVIDER_SITE_OTHER): Payer: Commercial Managed Care - PPO

## 2021-08-26 ENCOUNTER — Other Ambulatory Visit: Payer: Commercial Managed Care - PPO | Admitting: Family Medicine

## 2021-08-26 DIAGNOSIS — E559 Vitamin D deficiency, unspecified: Secondary | ICD-10-CM

## 2021-08-26 DIAGNOSIS — I10 Essential (primary) hypertension: Secondary | ICD-10-CM | POA: Diagnosis not present

## 2021-08-26 DIAGNOSIS — R252 Cramp and spasm: Secondary | ICD-10-CM | POA: Diagnosis not present

## 2021-08-26 DIAGNOSIS — E785 Hyperlipidemia, unspecified: Secondary | ICD-10-CM

## 2021-08-26 DIAGNOSIS — R7303 Prediabetes: Secondary | ICD-10-CM | POA: Diagnosis not present

## 2021-08-26 LAB — BASIC METABOLIC PANEL
BUN: 18 mg/dL (ref 6–23)
CO2: 26 mEq/L (ref 19–32)
Calcium: 9.6 mg/dL (ref 8.4–10.5)
Chloride: 103 mEq/L (ref 96–112)
Creatinine, Ser: 0.75 mg/dL (ref 0.40–1.20)
GFR: 86.22 mL/min (ref >60.00)
Glucose, Bld: 100 mg/dL — ABNORMAL HIGH (ref 70–99)
Potassium: 4.9 mEq/L (ref 3.5–5.1)
Sodium: 139 mEq/L (ref 135–145)

## 2021-08-26 LAB — LIPID PANEL
Cholesterol: 165 mg/dL (ref 0–200)
HDL: 62.1 mg/dL (ref >39.00)
LDL Cholesterol: 80 mg/dL (ref 0–99)
NonHDL: 102.65
Total CHOL/HDL Ratio: 3
Triglycerides: 114 mg/dL (ref 0.0–149.0)
VLDL: 22.8 mg/dL (ref 0.0–40.0)

## 2021-08-26 LAB — MAGNESIUM: Magnesium: 2.2 mg/dL (ref 1.5–2.5)

## 2021-08-26 LAB — VITAMIN D 25 HYDROXY (VIT D DEFICIENCY, FRACTURES): VITD: 37.46 ng/mL (ref 30.00–100.00)

## 2021-08-26 LAB — HEMOGLOBIN A1C: Hgb A1c MFr Bld: 6.7 % — ABNORMAL HIGH (ref 4.6–6.5)

## 2021-08-30 ENCOUNTER — Telehealth: Payer: Self-pay | Admitting: Family Medicine

## 2021-08-30 NOTE — Telephone Encounter (Signed)
[facility] called about this pt today @ 9.20 needing a confirmation/authorization for medication. They stated they will call us back again today

## 2021-09-02 NOTE — Telephone Encounter (Signed)
Returned call went over Atorvastatin that was sent in back in April with refills.

## 2021-09-19 ENCOUNTER — Ambulatory Visit: Payer: Commercial Managed Care - PPO | Admitting: Dermatology

## 2021-10-03 ENCOUNTER — Ambulatory Visit: Payer: Commercial Managed Care - PPO | Admitting: Dermatology

## 2021-10-06 ENCOUNTER — Telehealth (INDEPENDENT_AMBULATORY_CARE_PROVIDER_SITE_OTHER): Payer: Commercial Managed Care - PPO | Admitting: Family Medicine

## 2021-10-06 ENCOUNTER — Encounter: Payer: Self-pay | Admitting: Family Medicine

## 2021-10-06 VITALS — Temp 100.0°F

## 2021-10-06 DIAGNOSIS — E559 Vitamin D deficiency, unspecified: Secondary | ICD-10-CM | POA: Diagnosis not present

## 2021-10-06 DIAGNOSIS — J4521 Mild intermittent asthma with (acute) exacerbation: Secondary | ICD-10-CM

## 2021-10-06 DIAGNOSIS — U071 COVID-19: Secondary | ICD-10-CM | POA: Diagnosis not present

## 2021-10-06 MED ORDER — MOLNUPIRAVIR EUA 200MG CAPSULE: 4.0000 | ORAL_CAPSULE | Freq: Two times a day (BID) | ORAL | 0 refills | Status: AC

## 2021-10-06 MED ORDER — ALBUTEROL SULFATE HFA 108 (90 BASE) MCG/ACT IN AERS: 1.0000 | INHALATION_SPRAY | RESPIRATORY_TRACT | 3 refills | Status: AC | PRN

## 2021-10-06 MED ORDER — VITAMIN D (ERGOCALCIFEROL) 1.25 MG (50000 UNIT) PO CAPS: 50000.0000 [IU] | ORAL_CAPSULE | ORAL | 1 refills | Status: DC

## 2021-10-06 NOTE — Progress Notes (Signed)
Virtual Visit via Video Note  I connected with Renee Walters on 10/06/21 at 10:00 AM EDT by a video enabled telemedicine application and verified that I am speaking with the correct person using two identifiers.  Location: Patient: Home Provider: Office   I discussed the limitations of evaluation and management by telemedicine and the availability of in person appointments. The patient expressed understanding and agreed to proceed.  History of Present Illness: COVID19 Patient tested positive on home test one day ago.  She complains of 3 days of headache described as ache, low grade fever, nasal congestion, and non productive cough.. Onset of symptoms was  2 days ago and staying constant.She is drinking plenty of fluids.  Past history is significant for asthma and cardiac disease . Patient is non-smoker  ---------------------------------------------------------------------------------------------------    Observations/Objective: Gen: NAD, resting comfortably HEENT: EOMI Pulm: NWOB Skin: no rash on face Neuro: no facial asymmetry or dysmetria Psych: Normal affect   Assessment and Plan: COVID-19 Recommend OTC supportive care e.g.  ibuprofen, tylenol Molnupiravir 800 mg twice daily x5 days Quarantine precautions discussed Return precautions discussed Follow-up as needed   Follow Up Instructions:    I discussed the assessment and treatment plan with the patient. The patient was provided an opportunity to ask questions and all were answered. The patient agreed with the plan and demonstrated an understanding of the instructions.   The patient was advised to call back or seek an in-person evaluation if the symptoms worsen or if the condition fails to improve as anticipated.  I provided 12 minutes of non-face-to-face time during this encounter.   Bonnita Hollow, MD

## 2021-10-24 ENCOUNTER — Telehealth: Payer: Self-pay | Admitting: Family Medicine

## 2021-10-24 DIAGNOSIS — E785 Hyperlipidemia, unspecified: Secondary | ICD-10-CM

## 2021-10-24 DIAGNOSIS — I10 Essential (primary) hypertension: Secondary | ICD-10-CM

## 2021-10-24 NOTE — Telephone Encounter (Signed)
Caller Name: Pt Call back phone #: [phone]  Reason for Call: [facility] said [facility] a new order sent for her Amlodipine and her atorvastatin before they will refill.

## 2021-10-26 MED ORDER — AMLODIPINE BESYLATE 5 MG PO TABS: 5.0000 mg | ORAL_TABLET | Freq: Every day | ORAL | 1 refills | Status: DC

## 2021-10-26 MED ORDER — ATORVASTATIN CALCIUM 40 MG PO TABS: 40.0000 mg | ORAL_TABLET | Freq: Every day | ORAL | 3 refills | Status: DC

## 2021-10-26 NOTE — Telephone Encounter (Signed)
Refills sent in

## 2022-02-23 ENCOUNTER — Ambulatory Visit: Payer: Commercial Managed Care - PPO | Admitting: Family Medicine

## 2022-03-15 DIAGNOSIS — M25562 Pain in left knee: Secondary | ICD-10-CM | POA: Insufficient documentation

## 2022-03-30 ENCOUNTER — Other Ambulatory Visit: Payer: Self-pay | Admitting: Family Medicine

## 2022-03-30 DIAGNOSIS — E559 Vitamin D deficiency, unspecified: Secondary | ICD-10-CM

## 2022-04-14 ENCOUNTER — Ambulatory Visit (INDEPENDENT_AMBULATORY_CARE_PROVIDER_SITE_OTHER): Payer: Commercial Managed Care - PPO | Admitting: Family Medicine

## 2022-04-14 ENCOUNTER — Encounter: Payer: Self-pay | Admitting: Family Medicine

## 2022-04-14 VITALS — BP 126/72 | HR 60 | Temp 97.2°F | Ht 61.0 in | Wt 201.6 lb

## 2022-04-14 DIAGNOSIS — E559 Vitamin D deficiency, unspecified: Secondary | ICD-10-CM

## 2022-04-14 DIAGNOSIS — R7303 Prediabetes: Secondary | ICD-10-CM

## 2022-04-14 DIAGNOSIS — Z0000 Encounter for general adult medical examination without abnormal findings: Secondary | ICD-10-CM | POA: Diagnosis not present

## 2022-04-14 DIAGNOSIS — I10 Essential (primary) hypertension: Secondary | ICD-10-CM

## 2022-04-14 DIAGNOSIS — E785 Hyperlipidemia, unspecified: Secondary | ICD-10-CM | POA: Diagnosis not present

## 2022-04-14 DIAGNOSIS — R5383 Other fatigue: Secondary | ICD-10-CM

## 2022-04-14 LAB — COMPREHENSIVE METABOLIC PANEL
ALT: 34 U/L (ref 0–35)
AST: 21 U/L (ref 0–37)
Albumin: 4.4 g/dL (ref 3.5–5.2)
Alkaline Phosphatase: 107 U/L (ref 39–117)
BUN: 11 mg/dL (ref 6–23)
CO2: 30 mEq/L (ref 19–32)
Calcium: 9.7 mg/dL (ref 8.4–10.5)
Chloride: 100 mEq/L (ref 96–112)
Creatinine, Ser: 0.7 mg/dL (ref 0.40–1.20)
GFR: 93.25 mL/min (ref >60.00)
Glucose, Bld: 91 mg/dL (ref 70–99)
Potassium: 4.3 mEq/L (ref 3.5–5.1)
Sodium: 140 mEq/L (ref 135–145)
Total Bilirubin: 0.9 mg/dL (ref 0.2–1.2)
Total Protein: 7.1 g/dL (ref 6.0–8.3)

## 2022-04-14 LAB — URINALYSIS, ROUTINE W REFLEX MICROSCOPIC
Bilirubin Urine: NEGATIVE
Hgb urine dipstick: NEGATIVE
Leukocytes,Ua: NEGATIVE
Nitrite: NEGATIVE
Specific Gravity, Urine: 1.02 (ref 1.000–1.030)
Total Protein, Urine: NEGATIVE
Urine Glucose: >=1000 — AB
Urobilinogen, UA: 0.2 (ref 0.0–1.0)
pH: 6 (ref 5.0–8.0)

## 2022-04-14 LAB — CBC
HCT: 43.3 % (ref 36.0–46.0)
Hemoglobin: 14 g/dL (ref 12.0–15.0)
MCHC: 32.2 g/dL (ref 30.0–36.0)
MCV: 89.7 fl (ref 78.0–100.0)
Platelets: 310 10*3/uL (ref 150.0–400.0)
RBC: 4.83 Mil/uL (ref 3.87–5.11)
RDW: 15.8 % — ABNORMAL HIGH (ref 11.5–15.5)
WBC: 9 10*3/uL (ref 4.0–10.5)

## 2022-04-14 LAB — LIPID PANEL
Cholesterol: 154 mg/dL (ref 0–200)
HDL: 58.7 mg/dL (ref >39.00)
LDL Cholesterol: 78 mg/dL (ref 0–99)
NonHDL: 95.41
Total CHOL/HDL Ratio: 3
Triglycerides: 85 mg/dL (ref 0.0–149.0)
VLDL: 17 mg/dL (ref 0.0–40.0)

## 2022-04-14 LAB — HEMOGLOBIN A1C: Hgb A1c MFr Bld: 6.7 % — ABNORMAL HIGH (ref 4.6–6.5)

## 2022-04-14 LAB — MICROALBUMIN / CREATININE URINE RATIO
Creatinine,U: 140.8 mg/dL
Microalb Creat Ratio: 0.7 mg/g (ref 0.0–30.0)
Microalb, Ur: 1 mg/dL (ref 0.0–1.9)

## 2022-04-14 LAB — VITAMIN D 25 HYDROXY (VIT D DEFICIENCY, FRACTURES): VITD: 43.21 ng/mL (ref 30.00–100.00)

## 2022-04-14 LAB — TSH: TSH: 1.8 u[IU]/mL (ref 0.35–5.50)

## 2022-04-14 NOTE — Progress Notes (Signed)
Established Patient Office Visit   Subjective:  Patient ID: Renee Walters, female    DOB: [birth year]  Age: 62 y.o. MRN: [id number]  Chief Complaint  Patient presents with   Annual Exam    CPE, no concerns. Patient fasting.     HPI Encounter Diagnoses  Name Primary?   Vitamin D deficiency Yes   Pre-diabetes    Essential hypertension    Hyperlipidemia, unspecified hyperlipidemia type    Healthcare maintenance    Other fatigue    For health check and follow-up of above.  Has regular dental care.  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  Exercises been challenging for her due to some fatigue at the end of the day.  Doing well with ICD in place.  It has not gone off.  Tolerating the higher dose of atorvastatin.  Continues with high-dose vitamin D.  Continues with Wilder Glade for control of prediabetes.   Review of Systems  Constitutional: Negative.   HENT: Negative.    Eyes:  Negative for blurred vision, discharge and redness.  Respiratory: Negative.    Cardiovascular: Negative.   Gastrointestinal:  Negative for abdominal pain.  Genitourinary: Negative.   Musculoskeletal: Negative.  Negative for myalgias.  Skin:  Negative for rash.  Neurological:  Negative for tingling, loss of consciousness and weakness.  Endo/Heme/Allergies:  Negative for polydipsia.      04/14/2022   10:20 AM 04/14/2022    9:48 AM 05/30/2021   10:11 AM  Depression screen PHQ 2/9  Decreased Interest 0 0 0  Down, Depressed, Hopeless 0 0 0  PHQ - 2 Score 0 0 0  Altered sleeping 0    Tired, decreased energy 0    Change in appetite 0    Feeling bad or failure about yourself  0    Trouble concentrating 0    Moving slowly or fidgety/restless 0    Suicidal thoughts 0    PHQ-9 Score 0    Difficult doing work/chores Not difficult at all        Current Outpatient Medications:    atorvastatin (LIPITOR) 40 MG tablet, Take 1 tablet (40 mg total) by mouth daily., Disp: 90 tablet, Rfl: 3   cetirizine  (ZYRTEC) 10 MG tablet, Take 10 mg by mouth daily., Disp: , Rfl:    dapagliflozin propanediol (FARXIGA) 10 MG TABS tablet, TAKE 1 TABLET BY MOUTH EVERY DAY IN THE MORNING, Disp: , Rfl:    furosemide (LASIX) 20 MG tablet, Take 20 mg by mouth., Disp: , Rfl:    ibuprofen (ADVIL,MOTRIN) 200 MG tablet, Take 600 mg by mouth every 6 (six) hours as needed., Disp: , Rfl:    metoprolol succinate (TOPROL-XL) 100 MG 24 hr tablet, TAKE 1 TABLET DAILY WITH OR IMMEDIATELY FOLLOWING A MEAL, Disp: 90 tablet, Rfl: 0   Omega-3 Fatty Acids (FISH OIL ADULT GUMMIES PO), Take 1 capsule by mouth daily., Disp: , Rfl:    sacubitril-valsartan (ENTRESTO) 49-51 MG, Take 1 tablet by mouth 2 (two) times daily., Disp: 60 tablet, Rfl: 11   Vitamin D, Ergocalciferol, (DRISDOL) 1.25 MG (50000 UNIT) CAPS capsule, TAKE 1 CAPSULE (50,000 UNITS TOTAL) BY MOUTH EVERY 7 (SEVEN) DAYS, Disp: 4 capsule, Rfl: 5   albuterol (VENTOLIN HFA) 108 (90 Base) MCG/ACT inhaler, Inhale 1-2 puffs into the lungs every 4 (four) hours as needed., Disp: 1 each, Rfl: 3  Current Facility-Administered Medications:    0.9 %  sodium chloride infusion, 500 mL, Intravenous, Once, Milus Banister,  MD   Objective:     BP 126/72 (BP Location: Right Arm, Patient Position: Sitting, Cuff Size: Large)   Pulse 60   Temp (!) 97.2 F (36.2 C) (Temporal)   Ht '5\' 1"'$  (1.549 m)   Wt 201 lb 9.6 oz (91.4 kg)   SpO2 97%   BMI 38.09 kg/m  Wt Readings from Last 3 Encounters:  04/14/22 201 lb 9.6 oz (91.4 kg)  08/18/21 202 lb 6.4 oz (91.8 kg)  05/16/21 204 lb 3.2 oz (92.6 kg)      Physical Exam Constitutional:      General: She is not in acute distress.    Appearance: Normal appearance. She is not ill-appearing, toxic-appearing or diaphoretic.  HENT:     Head: Normocephalic and atraumatic.     Right Ear: Tympanic membrane, ear canal and external ear normal.     Left Ear: Tympanic membrane, ear canal and external ear normal.     Mouth/Throat:     Mouth: Mucous  membranes are moist.     Pharynx: Oropharynx is clear. No oropharyngeal exudate or posterior oropharyngeal erythema.  Eyes:     General: No scleral icterus.       Right eye: No discharge.        Left eye: No discharge.     Extraocular Movements: Extraocular movements intact.     Conjunctiva/sclera: Conjunctivae normal.     Pupils: Pupils are equal, round, and reactive to light.  Cardiovascular:     Rate and Rhythm: Normal rate and regular rhythm.     Pulses:          Dorsalis pedis pulses are 2+ on the right side and 2+ on the left side.       Posterior tibial pulses are 1+ on the right side and 1+ on the left side.  Pulmonary:     Effort: Pulmonary effort is normal. No respiratory distress.     Breath sounds: Normal breath sounds.  Abdominal:     General: Bowel sounds are normal.  Musculoskeletal:     Cervical back: No rigidity or tenderness.     Right lower leg: No edema.     Left lower leg: No edema.  Skin:    General: Skin is warm and dry.  Neurological:     Mental Status: She is alert and oriented to person, place, and time.  Psychiatric:        Mood and Affect: Mood normal.        Behavior: Behavior normal.    Diabetic Foot Exam - Simple   Simple Foot Form Diabetic Foot exam was performed with the following findings: Yes 04/14/2022 10:21 AM  Visual Inspection No deformities, no ulcerations, no other skin breakdown bilaterally: Yes Sensation Testing Intact to touch and monofilament testing bilaterally: Yes Pulse Check Posterior Tibialis and Dorsalis pulse intact bilaterally: Yes Comments     Diabetic Foot Exam - Simple   Simple Foot Form Diabetic Foot exam was performed with the following findings: Yes 04/14/2022 10:21 AM  Visual Inspection No deformities, no ulcerations, no other skin breakdown bilaterally: Yes Sensation Testing Intact to touch and monofilament testing bilaterally: Yes Pulse Check Posterior Tibialis and Dorsalis pulse intact bilaterally:  Yes Comments      No results found for any visits on 04/14/22.    The 10-year ASCVD risk score (Arnett DK, et al., 2019) is: 3.8%    Assessment & Plan:   Vitamin D deficiency -     VITAMIN D 25  Hydroxy (Vit-D Deficiency, Fractures)  Pre-diabetes -     Comprehensive metabolic panel -     Hemoglobin A1c -     Urinalysis, Routine w reflex microscopic -     Microalbumin / creatinine urine ratio  Essential hypertension -     CBC -     Comprehensive metabolic panel -     Urinalysis, Routine w reflex microscopic -     Microalbumin / creatinine urine ratio  Hyperlipidemia, unspecified hyperlipidemia type -     Comprehensive metabolic panel -     Lipid panel  Healthcare maintenance -     Ambulatory referral to Gastroenterology  Other fatigue -     TSH    Return in about 6 months (around 10/15/2022).    Libby Maw, MD

## 2022-04-20 ENCOUNTER — Encounter: Payer: Self-pay | Admitting: Gastroenterology

## 2022-05-18 ENCOUNTER — Encounter: Payer: Self-pay | Admitting: Gastroenterology

## 2022-05-18 ENCOUNTER — Ambulatory Visit (AMBULATORY_SURGERY_CENTER): Payer: Commercial Managed Care - PPO

## 2022-05-18 VITALS — Ht 61.0 in | Wt 200.0 lb

## 2022-05-18 DIAGNOSIS — Z86010 Personal history of colonic polyps: Secondary | ICD-10-CM

## 2022-05-18 MED ORDER — NA SULFATE-K SULFATE-MG SULF 17.5-3.13-1.6 GM/177ML PO SOLN
1.0000 | Freq: Once | ORAL | 0 refills | Status: AC
Start: 2022-05-18 — End: 2022-05-18

## 2022-05-18 NOTE — Progress Notes (Signed)
No egg or soy allergy known to patient  No issues known to pt with past sedation with any surgeries or procedures Patient denies ever being told they had issues or difficulty with intubation  No FH of Malignant Hyperthermia Pt is not on diet pills Pt is not on  home 02  Pt is not on blood thinners  Pt denies issues with constipation  Have any cardiac testing pending--no Pt instructed to use Singlecare[employer]com or GoodRx for a price reduction on prep  Patient's chart reviewed by Osvaldo Angst CNRA prior to previsit and patient appropriate for the [place].  Previsit completed and red dot placed by patient's name on their procedure day (on provider's schedule).

## 2022-05-28 ENCOUNTER — Telehealth: Payer: Self-pay | Admitting: *Deleted

## 2022-05-28 NOTE — Telephone Encounter (Signed)
Dr. Meridee Score,  This pt's cardiac EF is below the [facility] standard.  Her procedure will need to be done at the hospital.  Thanks,  Cathlyn Parsons

## 2022-06-02 ENCOUNTER — Encounter: Payer: Self-pay | Admitting: Gastroenterology

## 2022-06-02 ENCOUNTER — Ambulatory Visit (AMBULATORY_SURGERY_CENTER): Payer: Commercial Managed Care - PPO | Admitting: Gastroenterology

## 2022-06-02 VITALS — BP 172/81 | HR 72 | Temp 97.2°F | Resp 14 | Ht 61.0 in | Wt 200.0 lb

## 2022-06-02 DIAGNOSIS — Z09 Encounter for follow-up examination after completed treatment for conditions other than malignant neoplasm: Secondary | ICD-10-CM | POA: Diagnosis present

## 2022-06-02 DIAGNOSIS — D125 Benign neoplasm of sigmoid colon: Secondary | ICD-10-CM

## 2022-06-02 DIAGNOSIS — K635 Polyp of colon: Secondary | ICD-10-CM

## 2022-06-02 DIAGNOSIS — D128 Benign neoplasm of rectum: Secondary | ICD-10-CM

## 2022-06-02 DIAGNOSIS — D123 Benign neoplasm of transverse colon: Secondary | ICD-10-CM

## 2022-06-02 DIAGNOSIS — D127 Benign neoplasm of rectosigmoid junction: Secondary | ICD-10-CM

## 2022-06-02 DIAGNOSIS — Z86010 Personal history of colonic polyps: Secondary | ICD-10-CM | POA: Diagnosis not present

## 2022-06-02 MED ORDER — SODIUM CHLORIDE 0.9 % IV SOLN
500.0000 mL | Freq: Once | INTRAVENOUS | Status: AC
Start: 2022-06-02 — End: ?

## 2022-06-02 NOTE — Progress Notes (Signed)
A and O x3. Report to RN. Tolerated MAC anesthesia well. 

## 2022-06-02 NOTE — Progress Notes (Signed)
Pt. Given instructions to facilitate air passage at home, and after hours number.

## 2022-06-02 NOTE — Patient Instructions (Signed)
Impression/Recommendations:  Polyp, hemorrhoid, and high fiber diet handouts given to patient.  Use FiberCon 1-2 tablets by mouth daily.  Continue present medications. Await pathology results.  Repeat colonoscopy for surveillance.   Date to be determined after pathology results reviewed.  YOU HAD AN ENDOSCOPIC PROCEDURE TODAY AT THE [facility]:   Refer to the procedure report that was given to you for any specific questions about what was found during the examination.  If the procedure report does not answer your questions, please call your gastroenterologist to clarify.  If you requested that your care partner not be given the details of your procedure findings, then the procedure report has been included in a sealed envelope for you to review at your convenience later.  YOU SHOULD EXPECT: Some feelings of bloating in the abdomen. Passage of more gas than usual.  Walking can help get rid of the air that was put into your GI tract during the procedure and reduce the bloating. If you had a lower endoscopy (such as a colonoscopy or flexible sigmoidoscopy) you may notice spotting of blood in your stool or on the toilet paper. If you underwent a bowel prep for your procedure, you may not have a normal bowel movement for a few days.  Please Note:  You might notice some irritation and congestion in your nose or some drainage.  This is from the oxygen used during your procedure.  There is no need for concern and it should clear up in a day or so.  SYMPTOMS TO REPORT IMMEDIATELY:  Following lower endoscopy (colonoscopy or flexible sigmoidoscopy):  Excessive amounts of blood in the stool  Significant tenderness or worsening of abdominal pains  Swelling of the abdomen that is new, acute  Fever of 100F or higher  For urgent or emergent issues, a gastroenterologist can be reached at any hour by calling [phone]. Do not use MyChart messaging for urgent concerns.    DIET:  We do  recommend a small meal at first, but then you may proceed to your regular diet.  Drink plenty of fluids but you should avoid alcoholic beverages for 24 hours.  ACTIVITY:  You should plan to take it easy for the rest of today and you should NOT DRIVE or use heavy machinery until tomorrow (because of the sedation medicines used during the test).    FOLLOW UP: Our staff will call the number listed on your records the next business day following your procedure.  We will call around 7:15- 8:00 am to check on you and address any questions or concerns that you may have regarding the information given to you following your procedure. If we do not reach you, we will leave a message.     If any biopsies were taken you will be contacted by phone or by letter within the next 1-3 weeks.  Please call us at [phone] if you have not heard about the biopsies in 3 weeks.    SIGNATURES/CONFIDENTIALITY: You and/or your care partner have signed paperwork which will be entered into your electronic medical record.  These signatures attest to the fact that that the information above on your After Visit Summary has been reviewed and is understood.  Full responsibility of the confidentiality of this discharge information lies with you and/or your care-partner.

## 2022-06-02 NOTE — Progress Notes (Signed)
GASTROENTEROLOGY PROCEDURE H&P NOTE   Primary Care Physician: [name], MD  HPI: Renee Walters is a 62 y.o. female who presents for Colonoscopy for surveillance.  Past Medical History:  Diagnosis Date   Allergy    Asthma    Atypical mole 09/10/2002   outer right knee marked (tx exc)   Atypical mole 09/09/2012   LEFT UPPER NOSE MILD   Atypical nevi 03/11/2003   LUQ, SUP. SLIGHT/ MOD.   Atypical nevi 03/11/2003   LUQ INF. SLIGHT (TX PUNCH)   Atypical nevi 07/25/2006   RIGHT CHEST SLIGHT    Basal cell carcinoma 01/13/2010   INNER LEFT EAR TX MOHS   Family history of early CAD 01/24/2012   GERD (gastroesophageal reflux disease)    occ   Hyperlipidemia    Hypertension    Leg cramping 03/21/2017   Melanoma 05/22/2002   right upper outer claf (tx excision)   Neuromuscular disorder    sciatica   Palpitation 11/05/2014   Sciatica 03/21/2017   Skin cancer    WPW (Wolff-Parkinson-White syndrome)    s/p ablation in 1992   Past Surgical History:  Procedure Laterality Date   ABDOMINAL HYSTERECTOMY     CARDIAC CATHETERIZATION     CARDIAC ELECTROPHYSIOLOGY STUDY AND ABLATION     CHOLECYSTECTOMY     COLONOSCOPY     exploratory GYN surgery     pace maker  07/2018   skin cancer removal     several   TUBAL LIGATION     Current Outpatient Medications  Medication Sig Dispense Refill   atorvastatin (LIPITOR) 40 MG tablet Take 1 tablet (40 mg total) by mouth daily. 90 tablet 3   dapagliflozin propanediol (FARXIGA) 10 MG TABS tablet TAKE 1 TABLET BY MOUTH EVERY DAY IN THE MORNING     furosemide (LASIX) 20 MG tablet Take 20 mg by mouth.     metoprolol succinate (TOPROL-XL) 100 MG 24 hr tablet TAKE 1 TABLET DAILY WITH OR IMMEDIATELY FOLLOWING A MEAL 90 tablet 0   Omega-3 Fatty Acids (FISH OIL ADULT GUMMIES PO) Take 1 capsule by mouth daily.     sacubitril-valsartan (ENTRESTO) 49-51 MG Take 1 tablet by mouth 2 (two) times daily. 60 tablet 11   Vitamin D,  Ergocalciferol, (DRISDOL) 1.25 MG (50000 UNIT) CAPS capsule TAKE 1 CAPSULE (50,000 UNITS TOTAL) BY MOUTH EVERY 7 (SEVEN) DAYS 4 capsule 5   albuterol (VENTOLIN HFA) 108 (90 Base) MCG/ACT inhaler Inhale 1-2 puffs into the lungs every 4 (four) hours as needed. 1 each 3   cetirizine (ZYRTEC) 10 MG tablet Take 10 mg by mouth daily.     Emollient (COLLAGEN EX) Apply topically.     ibuprofen (ADVIL,MOTRIN) 200 MG tablet Take 600 mg by mouth every 6 (six) hours as needed. (Patient not taking: Reported on 05/18/2022)     Current Facility-Administered Medications  Medication Dose Route Frequency Provider Last Rate Last Admin   0.9 %  sodium chloride infusion  500 mL Intravenous Once [name], MD       0.9 %  sodium chloride infusion  500 mL Intravenous Once Mansouraty, Netty Starring., MD        Current Outpatient Medications:    atorvastatin (LIPITOR) 40 MG tablet, Take 1 tablet (40 mg total) by mouth daily., Disp: 90 tablet, Rfl: 3   dapagliflozin propanediol (FARXIGA) 10 MG TABS tablet, TAKE 1 TABLET BY MOUTH EVERY DAY IN THE MORNING, Disp: , Rfl:    furosemide (LASIX) 20  MG tablet, Take 20 mg by mouth., Disp: , Rfl:    metoprolol succinate (TOPROL-XL) 100 MG 24 hr tablet, TAKE 1 TABLET DAILY WITH OR IMMEDIATELY FOLLOWING A MEAL, Disp: 90 tablet, Rfl: 0   Omega-3 Fatty Acids (FISH OIL ADULT GUMMIES PO), Take 1 capsule by mouth daily., Disp: , Rfl:    sacubitril-valsartan (ENTRESTO) 49-51 MG, Take 1 tablet by mouth 2 (two) times daily., Disp: 60 tablet, Rfl: 11   Vitamin D, Ergocalciferol, (DRISDOL) 1.25 MG (50000 UNIT) CAPS capsule, TAKE 1 CAPSULE (50,000 UNITS TOTAL) BY MOUTH EVERY 7 (SEVEN) DAYS, Disp: 4 capsule, Rfl: 5   albuterol (VENTOLIN HFA) 108 (90 Base) MCG/ACT inhaler, Inhale 1-2 puffs into the lungs every 4 (four) hours as needed., Disp: 1 each, Rfl: 3   cetirizine (ZYRTEC) 10 MG tablet, Take 10 mg by mouth daily., Disp: , Rfl:    Emollient (COLLAGEN EX), Apply topically., Disp: , Rfl:     ibuprofen (ADVIL,MOTRIN) 200 MG tablet, Take 600 mg by mouth every 6 (six) hours as needed. (Patient not taking: Reported on 05/18/2022), Disp: , Rfl:   Current Facility-Administered Medications:    0.9 %  sodium chloride infusion, 500 mL, Intravenous, Once, [name], MD   0.9 %  sodium chloride infusion, 500 mL, Intravenous, Once, Mansouraty, Netty Starring., MD Allergies  Allergen Reactions   Codeine Nausea Only   Misc. Sulfonamide Containing Compounds     Other Reaction(s): Not available   Other     Vicryl sutures blisters at site    Penicillins Other (See Comments)    Passed out as a child   Neomycin-Bacitracin Zn-Polymyx Rash   Wound Dressing Adhesive Rash    When leaving Band-Aid on for long periods of time (latex)    Family History  Problem Relation Age of Onset   Arthritis Mother    COPD Mother    Diabetes Mother    Hearing loss Mother    Hypertension Mother    Miscarriages / Stillbirths Mother    Heart disease Father 26       s/p CABG, stents   Diabetes Father    Heart attack Father    Hyperlipidemia Father    Hypertension Father    Insurance account manager Parkinson White syndrome Sister    Cancer Sister    Diabetes Sister    Hyperlipidemia Sister    Hypertension Sister    Hyperlipidemia Brother    Hypertension Brother    Hyperlipidemia Brother    Hypertension Brother    Lung cancer Maternal Grandfather    Colon polyps Paternal Grandmother    Heart disease Paternal Grandfather    Insurance account manager Parkinson White syndrome Other        Sister's daughter   Colon cancer Neg Hx    Esophageal cancer Neg Hx    Rectal cancer Neg Hx    Stomach cancer Neg Hx    Social History   Socioeconomic History   Marital status: Married    Spouse name: Not on file   Number of children: 2   Years of education: Not on file   Highest education level: Not on file  Occupational History    Employer: [employer]  Tobacco Use   Smoking status: Never   Smokeless tobacco: Never  Vaping Use   Vaping  Use: Never used  Substance and Sexual Activity   Alcohol use: Yes    Comment: rare   Drug use: No   Sexual activity: Yes  Other Topics Concern   Not  on file  Social History Narrative   Not on file   Social Determinants of Health   Financial Resource Strain: Not on file  Food Insecurity: Not on file  Transportation Needs: Not on file  Physical Activity: Not on file  Stress: Not on file  Social Connections: Not on file  Intimate Partner Violence: Not on file    Physical Exam: Today's Vitals   06/02/22 1510 06/02/22 1515  BP: (!) 161/74   Pulse: 70   Temp:  (!) 97.2 F (36.2 C)  SpO2: 96%   Weight: 200 lb (90.7 kg)   Height: 5\' 1"  (1.549 m)    Body mass index is 37.79 kg/m. GEN: NAD EYE: Sclerae anicteric ENT: MMM CV: Non-tachycardic GI: Soft, NT/ND NEURO:  Alert & Oriented x 3  Lab Results: No results for input(s): "WBC", "HGB", "HCT", "PLT" in the last 72 hours. BMET No results for input(s): "NA", "K", "CL", "CO2", "GLUCOSE", "BUN", "CREATININE", "CALCIUM" in the last 72 hours. LFT No results for input(s): "PROT", "ALBUMIN", "AST", "ALT", "ALKPHOS", "BILITOT", "BILIDIR", "IBILI" in the last 72 hours. PT/INR No results for input(s): "LABPROT", "INR" in the last 72 hours.   Impression / Plan: This is a 62 y.o.female who presents for Colonoscopy for surveillance.  The risks and benefits of endoscopic evaluation/treatment were discussed with the patient and/or family; these include but are not limited to the risk of perforation, infection, bleeding, missed lesions, lack of diagnosis, severe illness requiring hospitalization, as well as anesthesia and sedation related illnesses.  The patient's history has been reviewed, patient examined, no change in status, and deemed stable for procedure.  The patient and/or family is agreeable to proceed.    Corliss Parish, MD [facility] Office # [phone]

## 2022-06-02 NOTE — Progress Notes (Signed)
Pt's states no medical or surgical changes since previsit or office visit. 

## 2022-06-02 NOTE — Progress Notes (Signed)
Called to room to assist during endoscopic procedure.  Patient ID and intended procedure confirmed with present staff. Received instructions for my participation in the procedure from the performing physician.  

## 2022-06-02 NOTE — Op Note (Signed)
[facility] Patient Name: Renee Walters Procedure Date: 06/02/2022 4:05 PM MRN: [id number] Endoscopist: Corliss Parish , MD, [id number] Age: 62 Referring MD:  Date of Birth: Feb 04, 1961 Gender: Female Account #: [id number] Procedure:                Colonoscopy Indications:              Surveillance: Personal history of adenomatous                            polyps on last colonoscopy > 5 years ago Medicines:                Monitored Anesthesia Care Procedure:                Pre-Anesthesia Assessment:                           - Prior to the procedure, a History and Physical                            was performed, and patient medications and                            allergies were reviewed. The patient's tolerance of                            previous anesthesia was also reviewed. The risks                            and benefits of the procedure and the sedation                            options and risks were discussed with the patient.                            All questions were answered, and informed consent                            was obtained. Prior Anticoagulants: The patient has                            taken no anticoagulant or antiplatelet agents. ASA                            Grade Assessment: II - A patient with mild systemic                            disease. After reviewing the risks and benefits,                            the patient was deemed in satisfactory condition to                            undergo the procedure.  After obtaining informed consent, the colonoscope                            was passed under direct vision. Throughout the                            procedure, the patient's blood pressure, pulse, and                            oxygen saturations were monitored continuously. The                            Olympus CF-HQ190L [id number]) Colonoscope was                            introduced through the  anus and advanced to the the                            cecum, identified by appendiceal orifice and                            ileocecal valve. The colonoscopy was somewhat                            difficult due to significant looping. Successful                            completion of the procedure was aided by changing                            the patient's position, using manual pressure,                            straightening and shortening the scope to obtain                            bowel loop reduction and using scope torsion. The                            patient tolerated the procedure. Scope In: 4:18:09 PM Scope Out: 4:36:48 PM Scope Withdrawal Time: 0 hours 12 minutes 5 seconds  Total Procedure Duration: 0 hours 18 minutes 39 seconds  Findings:                 Skin tags were found on perianal exam.                           The digital rectal exam findings include                            hemorrhoids. Pertinent negatives include no                            palpable rectal lesions.  The left colon was moderately tortuous.                           The colon (entire examined portion) revealed                            significantly excessive looping.                           Four sessile polyps were found in the rectum (2),                            sigmoid colon (1) and transverse colon (1). The                            polyps were 2 to 4 mm in size. These polyps were                            removed with a cold snare. Resection and retrieval                            were complete.                           Non-bleeding non-thrombosed external and internal                            hemorrhoids were found during retroflexion, during                            perianal exam and during digital exam. The                            hemorrhoids were Grade III (internal hemorrhoids                            that prolapse but require  manual reduction). Complications:            No immediate complications. Estimated Blood Loss:     Estimated blood loss was minimal. Impression:               - Perianal skin tags found on perianal exam.                           - Hemorrhoids found on digital rectal exam.                           - Tortuous left colon.                           - There was significant looping of the colon.                           - Four 2 to 4 mm polyps in the rectum, in the  sigmoid colon and in the transverse colon, removed                            with a cold snare. Resected and retrieved.                           - Non-bleeding non-thrombosed external and internal                            hemorrhoids. Recommendation:           - The patient will be observed post-procedure,                            until all discharge criteria are met.                           - Discharge patient to home.                           - Patient has a contact number available for                            emergencies. The signs and symptoms of potential                            delayed complications were discussed with the                            patient. Return to normal activities tomorrow.                            Written discharge instructions were provided to the                            patient.                           - High fiber diet.                           - Use FiberCon 1-2 tablets PO daily.                           - Continue present medications.                           - Await pathology results.                           - Repeat colonoscopy in 3/5/7 years for                            surveillance based on pathology results.                           - The findings and recommendations were  discussed                            with the patient.                           - The findings and recommendations were discussed                            with  the patient's family. Corliss Parish, MD 06/02/2022 4:47:57 PM

## 2022-06-05 ENCOUNTER — Telehealth: Payer: Self-pay

## 2022-06-05 NOTE — Telephone Encounter (Signed)
  Follow up Call-     06/02/2022    3:15 PM  Call back number  Post procedure Call Back phone  # [phone]  Permission to leave phone message Yes     Patient questions:  Do you have a fever, pain , or abdominal swelling? No. Pain Score  0 *  Have you tolerated food without any problems? Yes.    Have you been able to return to your normal activities? Yes.    Do you have any questions about your discharge instructions: Diet   No. Medications  No. Follow up visit  No.  Do you have questions or concerns about your Care? No.  Actions: * If pain score is 4 or above: No action needed, pain <4.

## 2022-06-07 ENCOUNTER — Encounter: Payer: Self-pay | Admitting: Gastroenterology

## 2022-09-20 ENCOUNTER — Other Ambulatory Visit: Payer: Self-pay | Admitting: Obstetrics and Gynecology

## 2022-09-20 DIAGNOSIS — R928 Other abnormal and inconclusive findings on diagnostic imaging of breast: Secondary | ICD-10-CM

## 2022-10-05 ENCOUNTER — Ambulatory Visit
Admission: RE | Admit: 2022-10-05 | Discharge: 2022-10-05 | Disposition: A | Discharge status: Home / Self Care | Payer: Commercial Managed Care - PPO | Source: Ambulatory Visit | Attending: Obstetrics and Gynecology | Admitting: Obstetrics and Gynecology

## 2022-10-05 DIAGNOSIS — R928 Other abnormal and inconclusive findings on diagnostic imaging of breast: Secondary | ICD-10-CM

## 2022-10-08 ENCOUNTER — Other Ambulatory Visit: Payer: Self-pay | Admitting: Family Medicine

## 2022-10-08 DIAGNOSIS — E559 Vitamin D deficiency, unspecified: Secondary | ICD-10-CM

## 2022-11-09 ENCOUNTER — Telehealth: Payer: Self-pay | Admitting: Family Medicine

## 2022-11-09 NOTE — Telephone Encounter (Signed)
Prescription Request  11/09/2022  LOV: 04/14/2022  What is the name of the medication or equipment? atorvastatin (LIPITOR) 40 MG tablet [[id number]]   Have you contacted your pharmacy to request a refill? No   Which pharmacy would you like this sent to?   [id number] [place], [place] - [address] [place] [address] Phone: [phone] Fax: [phone]   Patient notified that their request is being sent to the clinical staff for review and that they should receive a response within 2 business days.   Please advise at Mobile [phone] (mobile)

## 2022-11-13 NOTE — Telephone Encounter (Signed)
Please advise patient that no more refills will be given until she is seen at her next appt.

## 2022-11-21 ENCOUNTER — Ambulatory Visit (INDEPENDENT_AMBULATORY_CARE_PROVIDER_SITE_OTHER): Payer: Commercial Managed Care - PPO | Admitting: Family Medicine

## 2022-11-21 ENCOUNTER — Encounter: Payer: Self-pay | Admitting: Family Medicine

## 2022-11-21 VITALS — BP 118/68 | HR 60 | Temp 97.5°F | Ht 61.0 in | Wt 187.0 lb

## 2022-11-21 DIAGNOSIS — T887XXA Unspecified adverse effect of drug or medicament, initial encounter: Secondary | ICD-10-CM | POA: Diagnosis not present

## 2022-11-21 DIAGNOSIS — E785 Hyperlipidemia, unspecified: Secondary | ICD-10-CM | POA: Diagnosis not present

## 2022-11-21 DIAGNOSIS — R7303 Prediabetes: Secondary | ICD-10-CM | POA: Diagnosis not present

## 2022-11-21 LAB — BASIC METABOLIC PANEL
BUN: 17 mg/dL (ref 6–23)
CO2: 26 meq/L (ref 19–32)
Calcium: 9.1 mg/dL (ref 8.4–10.5)
Chloride: 105 meq/L (ref 96–112)
Creatinine, Ser: 0.68 mg/dL (ref 0.40–1.20)
GFR: 93.51 mL/min (ref >60.00)
Glucose, Bld: 102 mg/dL — ABNORMAL HIGH (ref 70–99)
Potassium: 4.4 meq/L (ref 3.5–5.1)
Sodium: 140 meq/L (ref 135–145)

## 2022-11-21 LAB — HEMOGLOBIN A1C: Hgb A1c MFr Bld: 6.4 % (ref 4.6–6.5)

## 2022-11-21 LAB — LDL CHOLESTEROL, DIRECT: Direct LDL: 163 mg/dL

## 2022-11-21 MED ORDER — COENZYME Q10 30 MG PO CAPS
30.0000 mg | ORAL_CAPSULE | Freq: Two times a day (BID) | ORAL | 5 refills | Status: DC
Start: 2022-11-21 — End: 2023-06-11

## 2022-11-21 MED ORDER — ATORVASTATIN CALCIUM 40 MG PO TABS
40.0000 mg | ORAL_TABLET | Freq: Every day | ORAL | 3 refills | Status: DC
Start: 2022-11-21 — End: 2023-12-14

## 2022-11-21 NOTE — Progress Notes (Signed)
Established Patient Office Visit   Subjective:  Patient ID: Renee Walters, female    DOB: March 10, 1960  Age: 62 y.o. MRN: [id number]  Chief Complaint  Patient presents with   Medication Refill    Med check/refill. Labs, pt fasting.     Medication Refill Pertinent negatives include no abdominal pain, myalgias, rash or weakness.   Encounter Diagnoses  Name Primary?   Pre-diabetes Yes   Hyperlipidemia, unspecified hyperlipidemia type    Medication side effect    For follow-up of above.  Continues Farxiga without issue for prediabetes and her heart.  Is concerned that atorvastatin may be causing myalgias.   Review of Systems  Constitutional: Negative.   HENT: Negative.    Eyes:  Negative for blurred vision, discharge and redness.  Respiratory: Negative.    Cardiovascular: Negative.   Gastrointestinal:  Negative for abdominal pain.  Genitourinary: Negative.   Musculoskeletal: Negative.  Negative for myalgias.  Skin:  Negative for rash.  Neurological:  Negative for tingling, loss of consciousness and weakness.  Endo/Heme/Allergies:  Negative for polydipsia.     Current Outpatient Medications:    cetirizine (ZYRTEC) 10 MG tablet, Take 10 mg by mouth daily., Disp: , Rfl:    co-enzyme Q-10 30 MG capsule, Take 1 capsule (30 mg total) by mouth 2 (two) times daily., Disp: 60 capsule, Rfl: 5   dapagliflozin propanediol (FARXIGA) 10 MG TABS tablet, TAKE 1 TABLET BY MOUTH EVERY DAY IN THE MORNING, Disp: , Rfl:    Emollient (COLLAGEN EX), Apply topically., Disp: , Rfl:    furosemide (LASIX) 20 MG tablet, Take 20 mg by mouth., Disp: , Rfl:    ibuprofen (ADVIL,MOTRIN) 200 MG tablet, Take 600 mg by mouth every 6 (six) hours as needed., Disp: , Rfl:    metoprolol succinate (TOPROL-XL) 100 MG 24 hr tablet, TAKE 1 TABLET DAILY WITH OR IMMEDIATELY FOLLOWING A MEAL, Disp: 90 tablet, Rfl: 0   sacubitril-valsartan (ENTRESTO) 49-51 MG, Take 1 tablet by mouth 2 (two) times daily., Disp: 60 tablet,  Rfl: 11   Vitamin D, Ergocalciferol, (DRISDOL) 1.25 MG (50000 UNIT) CAPS capsule, TAKE 1 CAPSULE (50,000 UNITS TOTAL) BY MOUTH EVERY 7 (SEVEN) DAYS, Disp: 4 capsule, Rfl: 5   albuterol (VENTOLIN HFA) 108 (90 Base) MCG/ACT inhaler, Inhale 1-2 puffs into the lungs every 4 (four) hours as needed., Disp: 1 each, Rfl: 3   atorvastatin (LIPITOR) 40 MG tablet, Take 1 tablet (40 mg total) by mouth daily., Disp: 90 tablet, Rfl: 3   Omega-3 Fatty Acids (FISH OIL ADULT GUMMIES PO), Take 1 capsule by mouth daily. (Patient not taking: Reported on 11/21/2022), Disp: , Rfl:   Current Facility-Administered Medications:    0.9 %  sodium chloride infusion, 500 mL, Intravenous, Once, [name], MD   0.9 %  sodium chloride infusion, 500 mL, Intravenous, Once, Mansouraty, Netty Starring., MD   Objective:     BP 118/68   Pulse 60   Temp (!) 97.5 F (36.4 C) (Temporal)   Ht 5\' 1"  (1.549 m)   Wt 187 lb (84.8 kg)   SpO2 98%   BMI 35.33 kg/m    Physical Exam Constitutional:      General: She is not in acute distress.    Appearance: Normal appearance. She is not ill-appearing, toxic-appearing or diaphoretic.  HENT:     Head: Normocephalic and atraumatic.     Right Ear: External ear normal.     Left Ear: External ear normal.  Eyes:  General: No scleral icterus.       Right eye: No discharge.        Left eye: No discharge.     Extraocular Movements: Extraocular movements intact.     Conjunctiva/sclera: Conjunctivae normal.  Cardiovascular:     Rate and Rhythm: Normal rate and regular rhythm.  Pulmonary:     Effort: Pulmonary effort is normal. No respiratory distress.     Breath sounds: Normal breath sounds. No wheezing, rhonchi or rales.  Skin:    General: Skin is warm and dry.  Neurological:     Mental Status: She is alert and oriented to person, place, and time.  Psychiatric:        Mood and Affect: Mood normal.        Behavior: Behavior normal.      No results found for any visits on  11/21/22.    The 10-year ASCVD risk score (Arnett DK, et al., 2019) is: 3.8%    Assessment & Plan:   Pre-diabetes -     Hemoglobin A1c -     Basic metabolic panel  Hyperlipidemia, unspecified hyperlipidemia type -     LDL cholesterol, direct -     Atorvastatin Calcium; Take 1 tablet (40 mg total) by mouth daily.  Dispense: 90 tablet; Refill: 3  Medication side effect -     Coenzyme Q10; Take 1 capsule (30 mg total) by mouth 2 (two) times daily.  Dispense: 60 capsule; Refill: 5    Return in about 6 months (around 05/22/2023), or if symptoms worsen or fail to improve.  Advised her to hold her atorvastatin for few weeks to see if the myalgias improved.  She can also restart it and take coenzyme every 10 with it.  Also suggested trying to take it 3 times weekly.  We also discussed using a different statin.  She thinks she may have taken pravastatin in the past.  Information was given on metformin.  [name], MD

## 2023-04-16 ENCOUNTER — Encounter: Payer: Self-pay | Admitting: Family Medicine

## 2023-04-16 ENCOUNTER — Ambulatory Visit: Payer: Commercial Managed Care - PPO | Admitting: Family Medicine

## 2023-04-16 ENCOUNTER — Other Ambulatory Visit: Payer: Self-pay | Admitting: Family Medicine

## 2023-04-16 VITALS — BP 142/86 | HR 68 | Temp 97.2°F | Ht 61.0 in | Wt 198.0 lb

## 2023-04-16 DIAGNOSIS — E785 Hyperlipidemia, unspecified: Secondary | ICD-10-CM

## 2023-04-16 DIAGNOSIS — R7303 Prediabetes: Secondary | ICD-10-CM

## 2023-04-16 DIAGNOSIS — I10 Essential (primary) hypertension: Secondary | ICD-10-CM | POA: Diagnosis not present

## 2023-04-16 DIAGNOSIS — E559 Vitamin D deficiency, unspecified: Secondary | ICD-10-CM

## 2023-04-16 DIAGNOSIS — E875 Hyperkalemia: Secondary | ICD-10-CM

## 2023-04-16 LAB — CBC WITH DIFFERENTIAL/PLATELET
Basophils Absolute: 0 10*3/uL (ref 0.0–0.1)
Basophils Relative: 0.4 % (ref 0.0–3.0)
Eosinophils Absolute: 0.2 10*3/uL (ref 0.0–0.7)
Eosinophils Relative: 3.3 % (ref 0.0–5.0)
HCT: 42.6 % (ref 36.0–46.0)
Hemoglobin: 13.7 g/dL (ref 12.0–15.0)
Lymphocytes Relative: 27.6 % (ref 12.0–46.0)
Lymphs Abs: 1.6 10*3/uL (ref 0.7–4.0)
MCHC: 32.2 g/dL (ref 30.0–36.0)
MCV: 90.8 fl (ref 78.0–100.0)
Monocytes Absolute: 0.3 10*3/uL (ref 0.1–1.0)
Monocytes Relative: 5.2 % (ref 3.0–12.0)
Neutro Abs: 3.8 10*3/uL (ref 1.4–7.7)
Neutrophils Relative %: 63.5 % (ref 43.0–77.0)
Platelets: 245 10*3/uL (ref 150.0–400.0)
RBC: 4.69 Mil/uL (ref 3.87–5.11)
RDW: 15.2 % (ref 11.5–15.5)
WBC: 6 10*3/uL (ref 4.0–10.5)

## 2023-04-16 LAB — COMPREHENSIVE METABOLIC PANEL
ALT: 27 U/L (ref 0–35)
AST: 15 U/L (ref 0–37)
Albumin: 4.5 g/dL (ref 3.5–5.2)
Alkaline Phosphatase: 67 U/L (ref 39–117)
BUN: 14 mg/dL (ref 6–23)
CO2: 28 meq/L (ref 19–32)
Calcium: 9.8 mg/dL (ref 8.4–10.5)
Chloride: 104 meq/L (ref 96–112)
Creatinine, Ser: 0.67 mg/dL (ref 0.40–1.20)
GFR: 93.58 mL/min (ref >60.00)
Glucose, Bld: 112 mg/dL — ABNORMAL HIGH (ref 70–99)
Potassium: 5.5 meq/L — ABNORMAL HIGH (ref 3.5–5.1)
Sodium: 141 meq/L (ref 135–145)
Total Bilirubin: 0.6 mg/dL (ref 0.2–1.2)
Total Protein: 7.2 g/dL (ref 6.0–8.3)

## 2023-04-16 LAB — URINALYSIS, ROUTINE W REFLEX MICROSCOPIC
Bilirubin Urine: NEGATIVE
Hgb urine dipstick: NEGATIVE
Ketones, ur: NEGATIVE
Leukocytes,Ua: NEGATIVE
Nitrite: NEGATIVE
RBC / HPF: NONE SEEN (ref 0–?)
Specific Gravity, Urine: >=1.03 — AB (ref 1.000–1.030)
Total Protein, Urine: NEGATIVE
Urine Glucose: >=1000 — AB
Urobilinogen, UA: 0.2 (ref 0.0–1.0)
pH: 6 (ref 5.0–8.0)

## 2023-04-16 LAB — LIPID PANEL
Cholesterol: 203 mg/dL — ABNORMAL HIGH (ref 0–200)
HDL: 71.7 mg/dL (ref >39.00)
LDL Cholesterol: 110 mg/dL — ABNORMAL HIGH (ref 0–99)
NonHDL: 131.16
Total CHOL/HDL Ratio: 3
Triglycerides: 107 mg/dL (ref 0.0–149.0)
VLDL: 21.4 mg/dL (ref 0.0–40.0)

## 2023-04-16 LAB — VITAMIN D 25 HYDROXY (VIT D DEFICIENCY, FRACTURES): VITD: 39.48 ng/mL (ref 30.00–100.00)

## 2023-04-16 LAB — HEMOGLOBIN A1C: Hgb A1c MFr Bld: 6.8 % — ABNORMAL HIGH (ref 4.6–6.5)

## 2023-04-16 NOTE — Progress Notes (Unsigned)
 Established Patient Office Visit   Subjective:  Patient ID: Renee Walters, female    DOB: 1960-03-10  Age: 63 y.o. MRN: [id number]  No chief complaint on file.   HPI Encounter Diagnoses  Name Primary?   Pre-diabetes Yes   Vitamin D deficiency    Essential hypertension    Hyperlipidemia, unspecified hyperlipidemia type    For follow-up of above.  Exercise has been difficult for her secondary to a problem with her left knee.  Following up with orthopedic surgery.  Blood pressure normally runs 120s to 130s over 70s.  She did not take her blood pressure medications this morning.  Continues with atorvastatin. {History (Optional):23778}  Review of Systems  Constitutional: Negative.   HENT: Negative.    Eyes:  Negative for blurred vision, discharge and redness.  Respiratory: Negative.    Cardiovascular: Negative.   Gastrointestinal:  Negative for abdominal pain.  Genitourinary: Negative.   Musculoskeletal:  Positive for joint pain. Negative for myalgias.  Skin:  Negative for rash.  Neurological:  Negative for tingling, loss of consciousness and weakness.  Endo/Heme/Allergies:  Negative for polydipsia.      04/16/2023    9:33 AM 04/14/2022   10:20 AM 04/14/2022    9:48 AM  Depression screen PHQ 2/9  Decreased Interest 0 0 0  Down, Depressed, Hopeless 0 0 0  PHQ - 2 Score 0 0 0  Altered sleeping 0 0   Tired, decreased energy 0 0   Change in appetite 0 0   Feeling bad or failure about yourself  0 0   Trouble concentrating 0 0   Moving slowly or fidgety/restless 0 0   Suicidal thoughts 0 0   PHQ-9 Score 0 0   Difficult doing work/chores Not difficult at all Not difficult at all       Current Outpatient Medications:    atorvastatin (LIPITOR) 40 MG tablet, Take 1 tablet (40 mg total) by mouth daily., Disp: 90 tablet, Rfl: 3   cetirizine (ZYRTEC) 10 MG tablet, Take 10 mg by mouth daily., Disp: , Rfl:    dapagliflozin propanediol (FARXIGA) 10 MG TABS tablet, TAKE 1 TABLET BY  MOUTH EVERY DAY IN THE MORNING, Disp: , Rfl:    Emollient (COLLAGEN EX), Apply topically., Disp: , Rfl:    furosemide (LASIX) 20 MG tablet, Take 20 mg by mouth., Disp: , Rfl:    metoprolol succinate (TOPROL-XL) 100 MG 24 hr tablet, TAKE 1 TABLET DAILY WITH OR IMMEDIATELY FOLLOWING A MEAL, Disp: 90 tablet, Rfl: 0   sacubitril-valsartan (ENTRESTO) 49-51 MG, Take 1 tablet by mouth 2 (two) times daily., Disp: 60 tablet, Rfl: 11   Vitamin D, Ergocalciferol, (DRISDOL) 1.25 MG (50000 UNIT) CAPS capsule, TAKE 1 CAPSULE (50,000 UNITS TOTAL) BY MOUTH EVERY 7 (SEVEN) DAYS, Disp: 4 capsule, Rfl: 5   albuterol (VENTOLIN HFA) 108 (90 Base) MCG/ACT inhaler, Inhale 1-2 puffs into the lungs every 4 (four) hours as needed., Disp: 1 each, Rfl: 3  Current Facility-Administered Medications:    0.9 %  sodium chloride infusion, 500 mL, Intravenous, Once, [name], MD   0.9 %  sodium chloride infusion, 500 mL, Intravenous, Once, Mansouraty, Netty Starring., MD   Objective:     BP (!) 142/86 Comment: Did not take BP medication this morning  Pulse 68   Temp (!) 97.2 F (36.2 C)   Ht 5\' 1"  (1.549 m)   Wt 198 lb (89.8 kg)   SpO2 98%   BMI 37.41 kg/m  BP  Readings from Last 3 Encounters:  04/16/23 (!) 142/86  11/21/22 118/68  06/02/22 (!) 172/81   Wt Readings from Last 3 Encounters:  04/16/23 198 lb (89.8 kg)  11/21/22 187 lb (84.8 kg)  06/02/22 200 lb (90.7 kg)      Physical Exam Constitutional:      General: She is not in acute distress.    Appearance: Normal appearance. She is not ill-appearing, toxic-appearing or diaphoretic.  HENT:     Head: Normocephalic and atraumatic.     Right Ear: External ear normal.     Left Ear: External ear normal.     Mouth/Throat:     Mouth: Mucous membranes are moist.     Pharynx: Oropharynx is clear. No oropharyngeal exudate or posterior oropharyngeal erythema.  Eyes:     General: No scleral icterus.       Right eye: No discharge.        Left eye: No  discharge.     Extraocular Movements: Extraocular movements intact.     Conjunctiva/sclera: Conjunctivae normal.     Pupils: Pupils are equal, round, and reactive to light.  Cardiovascular:     Rate and Rhythm: Normal rate and regular rhythm.  Pulmonary:     Effort: Pulmonary effort is normal. No respiratory distress.     Breath sounds: Normal breath sounds.  Abdominal:     General: Bowel sounds are normal.  Musculoskeletal:     Cervical back: No rigidity or tenderness.     Right lower leg: No edema.     Left lower leg: No edema.  Skin:    General: Skin is warm and dry.  Neurological:     Mental Status: She is alert and oriented to person, place, and time.  Psychiatric:        Mood and Affect: Mood normal.        Behavior: Behavior normal.      No results found for any visits on 04/16/23.  {Labs (Optional):23779}  The 10-year ASCVD risk score (Arnett DK, et al., 2019) is: 5.4%    Assessment & Plan:   Pre-diabetes -     Comprehensive metabolic panel -     Hemoglobin A1c  Vitamin D deficiency -     VITAMIN D 25 Hydroxy (Vit-D Deficiency, Fractures)  Essential hypertension -     CBC with Differential/Platelet -     Comprehensive metabolic panel -     Urinalysis, Routine w reflex microscopic  Hyperlipidemia, unspecified hyperlipidemia type -     Comprehensive metabolic panel -     Lipid panel    Return in about 6 months (around 10/17/2023).  Information given on managing hypertension as well as health maintenance and disease mention.  [name], MD

## 2023-04-17 DIAGNOSIS — E875 Hyperkalemia: Secondary | ICD-10-CM | POA: Insufficient documentation

## 2023-04-17 MED ORDER — VITAMIN D3 50 MCG (2000 UT) PO CAPS
2000.0000 [IU] | ORAL_CAPSULE | Freq: Every day | ORAL | 3 refills | Status: AC
Start: 2023-04-17 — End: ?

## 2023-04-17 NOTE — Addendum Note (Signed)
 Addended by: Andrez Grime on: 04/17/2023 08:28 AM   Modules accepted: Orders

## 2023-06-10 ENCOUNTER — Other Ambulatory Visit: Payer: Self-pay | Admitting: Family Medicine

## 2023-06-10 DIAGNOSIS — T887XXA Unspecified adverse effect of drug or medicament, initial encounter: Secondary | ICD-10-CM

## 2023-06-21 ENCOUNTER — Other Ambulatory Visit: Payer: Self-pay | Admitting: Family Medicine

## 2023-06-21 DIAGNOSIS — E559 Vitamin D deficiency, unspecified: Secondary | ICD-10-CM

## 2023-11-01 ENCOUNTER — Ambulatory Visit: Payer: Self-pay | Admitting: Family Medicine

## 2023-11-01 ENCOUNTER — Other Ambulatory Visit (INDEPENDENT_AMBULATORY_CARE_PROVIDER_SITE_OTHER)

## 2023-11-01 DIAGNOSIS — E875 Hyperkalemia: Secondary | ICD-10-CM

## 2023-11-01 LAB — POTASSIUM: Potassium: 3.8 meq/L (ref 3.5–5.1)

## 2023-11-04 ENCOUNTER — Other Ambulatory Visit: Payer: Self-pay | Admitting: Family Medicine

## 2023-11-04 DIAGNOSIS — E559 Vitamin D deficiency, unspecified: Secondary | ICD-10-CM

## 2023-11-04 DIAGNOSIS — E785 Hyperlipidemia, unspecified: Secondary | ICD-10-CM

## 2023-11-20 ENCOUNTER — Emergency Department (HOSPITAL_COMMUNITY)
Admission: EM | Admit: 2023-11-20 | Discharge: 2023-11-20 | Disposition: A | Discharge status: Home / Self Care | Attending: Emergency Medicine | Admitting: Emergency Medicine

## 2023-11-20 ENCOUNTER — Other Ambulatory Visit: Payer: Self-pay

## 2023-11-20 ENCOUNTER — Encounter (HOSPITAL_COMMUNITY): Payer: Self-pay

## 2023-11-20 ENCOUNTER — Emergency Department (HOSPITAL_COMMUNITY)

## 2023-11-20 DIAGNOSIS — W010XXA Fall on same level from slipping, tripping and stumbling without subsequent striking against object, initial encounter: Secondary | ICD-10-CM | POA: Insufficient documentation

## 2023-11-20 DIAGNOSIS — S6992XA Unspecified injury of left wrist, hand and finger(s), initial encounter: Secondary | ICD-10-CM | POA: Diagnosis present

## 2023-11-20 DIAGNOSIS — Z79899 Other long term (current) drug therapy: Secondary | ICD-10-CM | POA: Insufficient documentation

## 2023-11-20 DIAGNOSIS — I10 Essential (primary) hypertension: Secondary | ICD-10-CM | POA: Diagnosis not present

## 2023-11-20 DIAGNOSIS — S61012A Laceration without foreign body of left thumb without damage to nail, initial encounter: Secondary | ICD-10-CM | POA: Diagnosis not present

## 2023-11-20 DIAGNOSIS — Z23 Encounter for immunization: Secondary | ICD-10-CM | POA: Insufficient documentation

## 2023-11-20 MED ORDER — CEPHALEXIN 500 MG PO CAPS
500.0000 mg | ORAL_CAPSULE | Freq: Once | ORAL | Status: AC
  Administered 2023-11-20: 500 mg via ORAL
  Filled 2023-11-20: qty 1

## 2023-11-20 MED ORDER — TETANUS-DIPHTH-ACELL PERTUSSIS 5-2-15.5 LF-MCG/0.5 IM SUSP
0.5000 mL | Freq: Once | INTRAMUSCULAR | Status: AC
  Administered 2023-11-20: 0.5 mL via INTRAMUSCULAR
  Filled 2023-11-20: qty 0.5

## 2023-11-20 MED ORDER — CEPHALEXIN 500 MG PO CAPS: 500.0000 mg | ORAL_CAPSULE | Freq: Two times a day (BID) | ORAL | 0 refills | Status: AC

## 2023-11-20 MED ORDER — IBUPROFEN 800 MG PO TABS
800.0000 mg | ORAL_TABLET | Freq: Once | ORAL | Status: AC
  Administered 2023-11-20: 800 mg via ORAL
  Filled 2023-11-20: qty 1

## 2023-11-20 NOTE — ED Provider Notes (Signed)
 [place] EMERGENCY DEPARTMENT AT [facility] Provider Note   CSN: [id number] Arrival date & time: 11/20/23  1801     Patient presents with: Felton Charlies KATHEE Higinio is a 63 y.o. female here in the emergency department complaint of left thumb pain after reportedly tripping and falling and jabbing a key into the base of her left thumb.  Patient pulled out the key.  She is having pain at the base of the left thumb.  She did rinse it out at home with water.  She is not certain of her last tetanus shot   HPI     Prior to Admission medications   Medication Sig Start Date End Date Taking? Authorizing Provider  cephALEXin  (KEFLEX ) 500 MG capsule Take 1 capsule (500 mg total) by mouth 2 (two) times daily for 5 days. 11/21/23 11/26/23 Yes Yaniyah Koors, Donnice PARAS, MD  albuterol  (VENTOLIN  HFA) 108 [phone] Base) MCG/ACT inhaler Inhale 1-2 puffs into the lungs every 4 (four) hours as needed. 10/06/21 11/05/21  Sebastian Beverley KATHEE, MD  atorvastatin  (LIPITOR) 40 MG tablet Take 1 tablet (40 mg total) by mouth daily. 11/21/22   Berneta Elsie Sayre, MD  cetirizine (ZYRTEC) 10 MG tablet Take 10 mg by mouth daily.    [provider]  Cholecalciferol (VITAMIN D3) 50 MCG (2000 UT) capsule Take 1 capsule (2,000 Units total) by mouth daily. 04/17/23   Berneta Elsie Sayre, MD  Coenzyme Q-10 30 MG CAPS TAKE 1 CAPSULE BY MOUTH 2 TIMES DAILY. 06/11/23   Berneta Elsie Sayre, MD  dapagliflozin propanediol (FARXIGA) 10 MG TABS tablet TAKE 1 TABLET BY MOUTH EVERY DAY IN THE MORNING 11/18/19   [provider]  Emollient (COLLAGEN EX) Apply topically.    [provider]  furosemide  (LASIX ) 20 MG tablet Take 20 mg by mouth.    [provider]  metoprolol  succinate (TOPROL -XL) 100 MG 24 hr tablet TAKE 1 TABLET DAILY WITH OR IMMEDIATELY FOLLOWING A MEAL 01/30/19   Lavona Agent, MD  sacubitril -valsartan  (ENTRESTO ) 49-51 MG Take 1 tablet by mouth 2 (two) times daily. 10/09/17   Lavona Agent, MD    Allergies: Amlodipine , Codeine, Misc. sulfonamide containing compounds, Other, Penicillins, Neomycin-bacitracin zn-polymyx, and Wound dressing adhesive    Review of Systems  Updated Vital Signs BP (!) 182/97 (BP Location: Right Arm)   Pulse 84   Temp 98.4 F (36.9 C) (Oral)   Resp 16   SpO2 97%   Physical Exam Constitutional:      General: She is not in acute distress. HENT:     Head: Normocephalic and atraumatic.  Eyes:     Conjunctiva/sclera: Conjunctivae normal.     Pupils: Pupils are equal, round, and reactive to light.  Cardiovascular:     Rate and Rhythm: Normal rate and regular rhythm.  Pulmonary:     Effort: Pulmonary effort is normal. No respiratory distress.  Abdominal:     General: There is no distension.     Tenderness: There is no abdominal tenderness.  Skin:    General: Skin is warm and dry.     Comments: Small laceration near right thenar eminence, no active bleeding  Neurological:     General: No focal deficit present.     Mental Status: She is alert. Mental status is at baseline.  Psychiatric:        Mood and Affect: Mood normal.        Behavior: Behavior normal.     (all labs ordered are  listed, but only abnormal results are displayed) Labs Reviewed - No data to display  EKG: None  Radiology: DG Finger Thumb Left Result Date: 11/20/2023 CLINICAL DATA:  Recent fall with puncture wound at the first MCP joint, initial encounter EXAM: LEFT THUMB 2+V COMPARISON:  None Available. FINDINGS: Mild soft tissue swelling is noted at the first MCP joint. No acute fracture or dislocation is seen. No radiopaque foreign body is noted. IMPRESSION: Soft tissue changes consistent with the recent injury. No acute bony abnormality is noted. Electronically Signed   By: Oneil Devonshire M.D.   On: 11/20/2023 19:58     Procedures   Medications Ordered in the ED  Tdap (ADACEL) injection 0.5 mL (0.5 mLs Intramuscular Given 11/20/23 1915)  ibuprofen (ADVIL)  tablet 800 mg (800 mg Oral Given 11/20/23 1917)  cephALEXin  (KEFLEX ) capsule 500 mg (500 mg Oral Given 11/20/23 1917)                                    Medical Decision Making Amount and/or Complexity of Data Reviewed Radiology: ordered.  Risk Prescription drug management.   Patient is here with an isolated injury to the thumb.  Full range of motion of the joints.  No evident fracture noted on x-ray.  The wound is too small for suturing.  It was irrigated the bedside.  Will update her tetanus and put her on 5 days of Keflex  as a prophylactic antibiotic; no evidence of abscess or infection at this time. She is stable for discharge     Final diagnoses:  Injury of finger of left hand, initial encounter  Laceration of left thumb without foreign body, nail damage status unspecified, initial encounter  Hypertension, unspecified type    ED Discharge Orders          Ordered    cephALEXin  (KEFLEX ) 500 MG capsule  2 times daily        11/20/23 2001               Cottie Donnice PARAS, MD 11/20/23 2318

## 2023-11-20 NOTE — ED Triage Notes (Signed)
 Pt fell holding her keys today and has a small laceration to her left thumb.

## 2023-11-30 ENCOUNTER — Encounter: Payer: Self-pay | Admitting: Family Medicine

## 2023-12-14 ENCOUNTER — Other Ambulatory Visit: Payer: Self-pay | Admitting: Family Medicine

## 2023-12-14 DIAGNOSIS — E785 Hyperlipidemia, unspecified: Secondary | ICD-10-CM

## 2023-12-29 ENCOUNTER — Other Ambulatory Visit: Payer: Self-pay | Admitting: Family Medicine

## 2023-12-29 DIAGNOSIS — T887XXA Unspecified adverse effect of drug or medicament, initial encounter: Secondary | ICD-10-CM

## 2023-12-31 NOTE — Telephone Encounter (Signed)
 Refill request for Co-Q10. Last refill 06/11/2023 LOV 04/16/2023 No FOV currently scheduled.  Please send if appropriate.

## 2024-01-10 ENCOUNTER — Other Ambulatory Visit: Payer: Self-pay | Admitting: Family Medicine

## 2024-01-10 DIAGNOSIS — E785 Hyperlipidemia, unspecified: Secondary | ICD-10-CM

## 2024-01-16 NOTE — Telephone Encounter (Signed)
 Pharmacy requesting qty change to #90, 1 rf. Pt needs appointment.

## 2024-01-25 MED ORDER — ATORVASTATIN CALCIUM 40 MG PO TABS: 40.0000 mg | ORAL_TABLET | Freq: Every day | ORAL | 0 refills | Status: AC

## 2024-01-25 NOTE — Addendum Note (Signed)
 Addended by: KYM KARNA CROME on: 01/25/2024 02:40 PM   Modules accepted: Orders
